# Patient Record
Sex: Male | Born: 1990 | Race: White | Hispanic: No | Marital: Single | Smoking: Current every day smoker
Health system: Southern US, Community
[De-identification: ages and names within clinical notes are randomized; demographics above are authoritative.]

## PROBLEM LIST (undated history)

## (undated) DIAGNOSIS — F32A Depression, unspecified: Secondary | ICD-10-CM

---

## 2013-03-16 ENCOUNTER — Emergency Department (HOSPITAL_COMMUNITY)
Admission: EM | Admit: 2013-03-16 | Discharge: 2013-03-16 | Discharge status: Against Medical Advice | Payer: 59 | Attending: Emergency Medicine | Admitting: Emergency Medicine

## 2013-03-16 ENCOUNTER — Emergency Department (HOSPITAL_COMMUNITY): Payer: 59

## 2013-03-16 ENCOUNTER — Encounter (HOSPITAL_COMMUNITY): Payer: Self-pay | Admitting: Emergency Medicine

## 2013-03-16 DIAGNOSIS — Y929 Unspecified place or not applicable: Secondary | ICD-10-CM | POA: Insufficient documentation

## 2013-03-16 DIAGNOSIS — R000 Tachycardia, unspecified: Secondary | ICD-10-CM | POA: Insufficient documentation

## 2013-03-16 DIAGNOSIS — F17200 Nicotine dependence, unspecified, uncomplicated: Secondary | ICD-10-CM | POA: Insufficient documentation

## 2013-03-16 DIAGNOSIS — Y9389 Activity, other specified: Secondary | ICD-10-CM | POA: Insufficient documentation

## 2013-03-16 DIAGNOSIS — R509 Fever, unspecified: Secondary | ICD-10-CM

## 2013-03-16 DIAGNOSIS — T401X4A Poisoning by heroin, undetermined, initial encounter: Secondary | ICD-10-CM | POA: Insufficient documentation

## 2013-03-16 DIAGNOSIS — T401X1A Poisoning by heroin, accidental (unintentional), initial encounter: Secondary | ICD-10-CM | POA: Insufficient documentation

## 2013-03-16 LAB — COMPREHENSIVE METABOLIC PANEL
ALT: 65 U/L — ABNORMAL HIGH (ref 0–53)
AST: 57 U/L — ABNORMAL HIGH (ref 0–37)
Albumin: 3.5 g/dL (ref 3.5–5.2)
Alkaline Phosphatase: 73 U/L (ref 39–117)
BUN: 7 mg/dL (ref 6–23)
CO2: 27 mEq/L (ref 19–32)
Calcium: 8.7 mg/dL (ref 8.4–10.5)
Chloride: 101 mEq/L (ref 96–112)
Creatinine, Ser: 0.91 mg/dL (ref 0.50–1.35)
GFR calc Af Amer: >90 mL/min (ref >90)
GFR calc non Af Amer: >90 mL/min (ref >90)
Glucose, Bld: 158 mg/dL — ABNORMAL HIGH (ref 70–99)
Potassium: 3.9 mEq/L (ref 3.5–5.1)
Sodium: 136 mEq/L (ref 135–145)
Total Bilirubin: 0.3 mg/dL (ref 0.3–1.2)
Total Protein: 6.7 g/dL (ref 6.0–8.3)

## 2013-03-16 LAB — CBC WITH DIFFERENTIAL/PLATELET
Basophils Absolute: 0 10*3/uL (ref 0.0–0.1)
Basophils Relative: 0 % (ref 0–1)
Eosinophils Absolute: 0 10*3/uL (ref 0.0–0.7)
Eosinophils Relative: 0 % (ref 0–5)
HCT: 40.6 % (ref 39.0–52.0)
Hemoglobin: 13.6 g/dL (ref 13.0–17.0)
Lymphocytes Relative: 7 % — ABNORMAL LOW (ref 12–46)
Lymphs Abs: 0.7 10*3/uL (ref 0.7–4.0)
MCH: 26.4 pg (ref 26.0–34.0)
MCHC: 33.5 g/dL (ref 30.0–36.0)
MCV: 78.8 fL (ref 78.0–100.0)
Monocytes Absolute: 0.9 10*3/uL (ref 0.1–1.0)
Monocytes Relative: 9 % (ref 3–12)
Neutro Abs: 8 10*3/uL — ABNORMAL HIGH (ref 1.7–7.7)
Neutrophils Relative %: 83 % — ABNORMAL HIGH (ref 43–77)
Platelets: 242 10*3/uL (ref 150–400)
RBC: 5.15 MIL/uL (ref 4.22–5.81)
RDW: 13.2 % (ref 11.5–15.5)
WBC: 9.5 10*3/uL (ref 4.0–10.5)

## 2013-03-16 LAB — URINALYSIS, ROUTINE W REFLEX MICROSCOPIC
Bilirubin Urine: NEGATIVE
Glucose, UA: 500 mg/dL — AB
Hgb urine dipstick: NEGATIVE
Ketones, ur: NEGATIVE mg/dL
Leukocytes, UA: NEGATIVE
Nitrite: NEGATIVE
Protein, ur: 100 mg/dL — AB
Specific Gravity, Urine: 1.027 (ref 1.005–1.030)
Urobilinogen, UA: 0.2 mg/dL (ref 0.0–1.0)
pH: 6.5 (ref 5.0–8.0)

## 2013-03-16 LAB — URINE MICROSCOPIC-ADD ON

## 2013-03-16 LAB — SEDIMENTATION RATE: Sed Rate: 5 mm/hr (ref 0–16)

## 2013-03-16 MED ORDER — IBUPROFEN 200 MG PO TABS
600.0000 mg | ORAL_TABLET | Freq: Once | ORAL | Status: AC
  Administered 2013-03-16: 600 mg via ORAL
  Filled 2013-03-16: qty 3

## 2013-03-16 MED ORDER — SODIUM CHLORIDE 0.9 % IV BOLUS (SEPSIS)
1000.0000 mL | Freq: Once | INTRAVENOUS | Status: AC
  Administered 2013-03-16: 1000 mL via INTRAVENOUS

## 2013-03-16 NOTE — ED Provider Notes (Signed)
CSN: [id number]     Arrival date & time 03/16/13  1238 History   First MD Initiated Contact with Patient 03/16/13 1246     Chief Complaint  Patient presents with  . Drug Overdose   (Consider location/radiation/quality/duration/timing/severity/associated sxs/prior Treatment) HPI  22 year old male with an unintentional heroin overdose. Patient with a long standing history of abuse. Denies intention to harm himself. He denies coingestion. Found by mother and she states his tongue was out of his mouth and he looked blue. Given narcan by EMS with brisk response. Currently no complaints. Denies any pain anywhere. No SOB. Pt offered to have ACT team speak with him in terms of his drug abuse but he is declining. Denies SI/HI or hallucinations.   History reviewed. No pertinent past medical history. History reviewed. No pertinent past surgical history. No family history on file. History  Substance Use Topics  . Smoking status: Current Every Day Smoker  . Smokeless tobacco: Not on file  . Alcohol Use: No    Review of Systems  All systems reviewed and negative, other than as noted in HPI.   Allergies  Review of patient's allergies indicates no known allergies.  Home Medications   Current Outpatient Rx  Name  Route  Sig  Dispense  Refill  . acetaminophen (TYLENOL) 500 MG tablet   Oral   Take 1,500 mg by mouth once.          BP 127/75  Pulse 122  Temp(Src) 102.2 F (39 C) (Oral)  Resp 21  SpO2 98% Physical Exam  Nursing note and vitals reviewed. Constitutional: He appears well-developed and well-nourished. No distress.  HENT:  Head: Normocephalic and atraumatic.  Eyes: Conjunctivae are normal. Right eye exhibits no discharge. Left eye exhibits no discharge.  Neck: Neck supple.  Cardiovascular: Regular rhythm and normal heart sounds.  Exam reveals no gallop and no friction rub.   No murmur heard. tachcyardic  Pulmonary/Chest: Effort normal and breath sounds normal. No  respiratory distress.  Abdominal: Soft. He exhibits no distension. There is no tenderness.  Musculoskeletal: He exhibits no edema and no tenderness.  Lower extremities symmetric as compared to each other. No calf tenderness. Negative Homan's. No palpable cords.   Neurological: He is alert.  Skin: Skin is warm and dry. He is not diaphoretic.  Psychiatric: He has a normal mood and affect. His behavior is normal. Thought content normal.    ED Course  Procedures (including critical care time) Labs Review Labs Reviewed - No data to display Imaging Review No results found.  MDM   1. Heroin overdose, initial encounter   2. Fever      22yM with unintentional heroin overdose. Given narcan. Pt unwilling to stay for observation or further w/u of fever. Understands that potential abnormal results may not be communicated to him. Has medical decision making capability. Understands half life of narcan less than many opiates and at risk for worsening symptoms, respiratory depression, anoxic brain injury, death, etc. Has no interest at this time in terms of getting help for drug abuse. Left ED under own power and in NAD.    Raeford Razor, MD 03/22/13 [phone]

## 2013-03-16 NOTE — ED Notes (Signed)
Per EMS: initially called out for unconscious pt upon arrival pt breathing about 6 times per minute. Nasal trumpet used, 20 g in left hand given 2 of Narcan. Pt not saying what he took but had orange cap in mouth.

## 2013-03-16 NOTE — ED Notes (Signed)
Pt denies SI/HI, states he took too much heroin today. Not sure how much he took but states it was too much.

## 2013-03-16 NOTE — ED Notes (Signed)
Bed: YN82 Expected date:  Expected time:  Means of arrival:  Comments: ems- overdose

## 2013-03-17 LAB — HEPATITIS PANEL, ACUTE
HCV Ab: NEGATIVE
Hep A IgM: NEGATIVE
Hep B C IgM: NEGATIVE
Hepatitis B Surface Ag: NEGATIVE

## 2013-03-22 LAB — CULTURE, BLOOD (ROUTINE X 2): Culture: NO GROWTH

## 2015-11-02 ENCOUNTER — Emergency Department (HOSPITAL_COMMUNITY)
Admission: EM | Admit: 2015-11-02 | Discharge: 2015-11-02 | Disposition: A | Discharge status: Home / Self Care | Payer: Managed Care, Other (non HMO) | Attending: Emergency Medicine | Admitting: Emergency Medicine

## 2015-11-02 ENCOUNTER — Encounter (HOSPITAL_COMMUNITY): Payer: Self-pay

## 2015-11-02 DIAGNOSIS — K0889 Other specified disorders of teeth and supporting structures: Secondary | ICD-10-CM | POA: Diagnosis not present

## 2015-11-02 DIAGNOSIS — F17200 Nicotine dependence, unspecified, uncomplicated: Secondary | ICD-10-CM | POA: Insufficient documentation

## 2015-11-02 NOTE — ED Notes (Signed)
Pt presents with c/o upper right side dental pain. Pt reports the pain has been ongoing but became unbearable today.

## 2018-12-18 ENCOUNTER — Encounter (HOSPITAL_COMMUNITY): Payer: Self-pay | Admitting: *Deleted

## 2018-12-18 ENCOUNTER — Other Ambulatory Visit: Payer: Self-pay

## 2018-12-18 ENCOUNTER — Emergency Department (HOSPITAL_COMMUNITY): Payer: No Typology Code available for payment source

## 2018-12-18 ENCOUNTER — Emergency Department (HOSPITAL_COMMUNITY)
Admission: EM | Admit: 2018-12-18 | Discharge: 2018-12-18 | Disposition: A | Discharge status: Home / Self Care | Payer: No Typology Code available for payment source | Attending: Emergency Medicine | Admitting: Emergency Medicine

## 2018-12-18 DIAGNOSIS — Y999 Unspecified external cause status: Secondary | ICD-10-CM | POA: Diagnosis not present

## 2018-12-18 DIAGNOSIS — Y939 Activity, unspecified: Secondary | ICD-10-CM | POA: Diagnosis not present

## 2018-12-18 DIAGNOSIS — R079 Chest pain, unspecified: Secondary | ICD-10-CM | POA: Insufficient documentation

## 2018-12-18 DIAGNOSIS — Y929 Unspecified place or not applicable: Secondary | ICD-10-CM | POA: Insufficient documentation

## 2018-12-18 DIAGNOSIS — Z87891 Personal history of nicotine dependence: Secondary | ICD-10-CM | POA: Diagnosis not present

## 2018-12-18 MED ORDER — ACETAMINOPHEN 500 MG PO TABS: 500.0000 mg | ORAL_TABLET | Freq: Four times a day (QID) | ORAL | 0 refills | Status: DC | PRN

## 2018-12-18 MED ORDER — IBUPROFEN 600 MG PO TABS: 600.0000 mg | ORAL_TABLET | Freq: Four times a day (QID) | ORAL | 0 refills | Status: DC | PRN

## 2018-12-18 MED ORDER — METHOCARBAMOL 500 MG PO TABS: 500.0000 mg | ORAL_TABLET | Freq: Two times a day (BID) | ORAL | 0 refills | Status: DC
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
## 2018-12-18 NOTE — ED Notes (Signed)
Bed: WTR7 Expected date:  Expected time:  Means of arrival:  Comments: 

## 2018-12-18 NOTE — Discharge Instructions (Signed)
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
## 2019-01-07 ENCOUNTER — Encounter (HOSPITAL_COMMUNITY): Payer: Self-pay

## 2019-01-07 ENCOUNTER — Other Ambulatory Visit: Payer: Self-pay

## 2019-01-07 ENCOUNTER — Emergency Department (HOSPITAL_COMMUNITY)
Admission: EM | Admit: 2019-01-07 | Discharge: 2019-01-07 | Disposition: A | Discharge status: Home / Self Care | Payer: Self-pay | Attending: Emergency Medicine | Admitting: Emergency Medicine

## 2019-01-07 DIAGNOSIS — L2489 Irritant contact dermatitis due to other agents: Secondary | ICD-10-CM | POA: Insufficient documentation

## 2019-01-07 DIAGNOSIS — Z87891 Personal history of nicotine dependence: Secondary | ICD-10-CM | POA: Insufficient documentation

## 2019-01-07 MED ORDER — PREDNISONE 20 MG PO TABS: ORAL_TABLET | ORAL | 0 refills | Status: DC

## 2019-01-07 MED ORDER — METHYLPREDNISOLONE SODIUM SUCC 125 MG IJ SOLR
125.0000 mg | Freq: Once | INTRAMUSCULAR | Status: AC
Start: 2019-01-07 — End: 2019-01-07
  Administered 2019-01-07: 04:00:00 125 mg via INTRAVENOUS
  Filled 2019-01-07: qty 2

## 2019-01-07 MED ORDER — DIPHENHYDRAMINE HCL 50 MG/ML IJ SOLN
25.0000 mg | Freq: Once | INTRAMUSCULAR | Status: AC
  Administered 2019-01-07: 25 mg via INTRAVENOUS
  Filled 2019-01-07: qty 1

## 2019-01-07 MED ORDER — TRIAMCINOLONE ACETONIDE 0.5 % EX OINT: 1.0000 "application " | TOPICAL_OINTMENT | Freq: Two times a day (BID) | CUTANEOUS | 3 refills | Status: DC

## 2019-01-07 MED ORDER — FAMOTIDINE 20 MG PO TABS: 20.0000 mg | ORAL_TABLET | Freq: Two times a day (BID) | ORAL | 0 refills | Status: DC

## 2019-01-07 MED ORDER — FAMOTIDINE IN NACL 20-0.9 MG/50ML-% IV SOLN
20.0000 mg | Freq: Once | INTRAVENOUS | Status: AC
  Administered 2019-01-07: 20 mg via INTRAVENOUS
  Filled 2019-01-07: qty 50

## 2019-01-07 MED ORDER — DIPHENHYDRAMINE HCL 25 MG PO TABS: 25.0000 mg | ORAL_TABLET | Freq: Four times a day (QID) | ORAL | 0 refills | Status: DC | PRN

## 2019-01-07 MED ORDER — TRIAMCINOLONE ACETONIDE 0.5 % EX OINT
TOPICAL_OINTMENT | Freq: Once | CUTANEOUS | Status: AC
  Administered 2019-01-07: 04:00:00 via TOPICAL
  Filled 2019-01-07: qty 15

## 2019-01-07 MED ORDER — HYDROCORTISONE 1 % EX CREA: TOPICAL_CREAM | CUTANEOUS | 0 refills | Status: DC

## 2019-01-07 MED ORDER — HYDROCORTISONE 1 % EX CREA
TOPICAL_CREAM | Freq: Once | CUTANEOUS | Status: AC
  Administered 2019-01-07: 04:00:00 via TOPICAL
  Filled 2019-01-07: qty 28

## 2019-01-07 NOTE — ED Provider Notes (Signed)
Emergency Department Provider Note   I have reviewed the triage vital signs and the nursing notes.   HISTORY  Chief Complaint Allergic Reaction   HPI Frederick Franklin is a 28 y.o. male who presents the emergency department today for rash.  Patient was working on some new grout over the last week and over the last couple days a progressively worsening swelling, itching, redness with multiple draining pustules to his face and even worse to his right axilla and proximal arm on that side.  Patient states his been leaking some light yellow clear drainage.  The biggest concern is the itching.  No fevers, nausea, vomiting.  No history of allergies in the past.   No other associated or modifying symptoms.    History reviewed. No pertinent past medical history.  There are no active problems to display for this patient.   History reviewed. No pertinent surgical history.  Current Outpatient Rx  . Order #: [id number] Class: Normal  . Order #: [id number] Class: Print  . Order #: [id number] Class: Print  . Order #: [id number] Class: Print  . Order #: [id number] Class: Print  . Order #: [id number] Class: Print    Allergies Patient has no known allergies.  History reviewed. No pertinent family history.  Social History Social History   Tobacco Use  . Smoking status: Former Games developermoker  . Smokeless tobacco: Never Used  Substance Use Topics  . Alcohol use: No  . Drug use: Never    Review of Systems  All other systems negative except as documented in the HPI. All pertinent positives and negatives as reviewed in the HPI. ____________________________________________   PHYSICAL EXAM:  VITAL SIGNS: ED Triage Vitals  Enc Vitals Group     BP 01/07/19 0152 133/87     Pulse Rate 01/07/19 0152 83     Resp 01/07/19 0152 18     Temp 01/07/19 0152 (!) 100.4 F (38 C)     Temp Source 01/07/19 0152 Oral     SpO2 01/07/19 0152 100 %     Weight 01/07/19 0152 250 lb (113.4 kg)     Height 01/07/19 0152 6'  (1.829 m)    Constitutional: Alert and oriented. Well appearing and in no acute distress. Eyes: significant edema to the point his eyes are barely open, Conjunctivae are normal. PERRL. EOMI. Head: Atraumatic. Nose: No congestion/rhinnorhea. Mouth/Throat: Mucous membranes are moist.  Oropharynx non-erythematous. Neck: No stridor.  No meningeal signs.   Cardiovascular: Normal rate, regular rhythm. Good peripheral circulation. Grossly normal heart sounds.   Respiratory: Normal respiratory effort.  No retractions. Lungs CTAB. Gastrointestinal: Soft and nontender. No distention.  Musculoskeletal: No lower extremity tenderness nor edema. No gross deformities of extremities. Neurologic:  Normal speech and language. No gross focal neurologic deficits are appreciated.  Skin:  Skin is warm, dry and intact. Contact dermatitis to face, right upper medial arm and axilla, bilateral hands, wrists and distal arms.   ____________________________________________   LABS (all labs ordered are listed, but only abnormal results are displayed)  Labs Reviewed - No data to display ____________________________________________  RADIOLOGY  No results found.  ____________________________________________   PROCEDURES  Procedure(s) performed:   Procedures   ____________________________________________   INITIAL IMPRESSION / ASSESSMENT AND PLAN / ED COURSE  Doubt anaphylactic reaction it appears patient has pretty severe contact dermatitis.  Will initiate steroids and antipruritics.  Pt VS reviewed and initially had a mild fever. This resolved without intervention, may have been error or 2/2 immune response itself. The rash seemed very  clearly like contact dermatitis. No e/o SJS, cellulitis, meningitis or other infectious causes. tx in ED provided significant benefit and patient had vast improvement in symptoms and rash. Taper steroids and symptomatic treatment at home, rturn if worsening.       Pertinent labs & imaging results that were available during my care of the patient were reviewed by me and considered in my medical decision making (see chart for details).   A medical screening exam was performed and I feel the patient has had an appropriate workup for their chief complaint at this time and likelihood of emergent condition existing is low. They have been counseled on decision, discharge, follow up and which symptoms necessitate immediate return to the emergency department. They or their family verbally stated understanding and agreement with plan and discharged in stable condition.   ____________________________________________  FINAL CLINICAL IMPRESSION(S) / ED DIAGNOSES  Final diagnoses:  Irritant contact dermatitis due to other agents     MEDICATIONS GIVEN DURING THIS VISIT:  Medications  diphenhydrAMINE (BENADRYL) injection 25 mg (25 mg Intravenous Given 01/07/19 0330)  methylPREDNISolone sodium succinate (SOLU-MEDROL) 125 mg/2 mL injection 125 mg (125 mg Intravenous Given 01/07/19 0330)  famotidine (PEPCID) IVPB 20 mg premix (0 mg Intravenous Stopped 01/07/19 0359)  triamcinolone ointment (KENALOG) 0.5 % ( Topical Given 01/07/19 0406)  hydrocortisone cream 1 % ( Topical Given 01/07/19 0406)     NEW OUTPATIENT MEDICATIONS STARTED DURING THIS VISIT:  Discharge Medication List as of 01/07/2019  6:22 AM    START taking these medications   Details  famotidine (PEPCID) 20 MG tablet Take 1 tablet (20 mg total) by mouth 2 (two) times daily., Starting Sat 01/07/2019, Print    hydrocortisone cream 1 % Apply to affected area 2 times daily on face, Print    predniSONE (DELTASONE) 20 MG tablet 3 tabs po daily x 3 days, then 2 tabs x 3 days, then 1.5 tabs x 3 days, then 1 tab x 3 days, then 0.5 tabs x 3 days, Print    triamcinolone ointment (KENALOG) 0.5 % Apply 1 application topically 2 (two) times daily. On body and extremities, NOT face, Starting Sat 01/07/2019, Print         Note:  This note was prepared with assistance of Dragon voice recognition software. Occasional wrong-word or sound-a-like substitutions may have occurred due to the inherent limitations of voice recognition software.   Merrily Pew, MD 01/08/19 1050

## 2019-01-07 NOTE — ED Triage Notes (Signed)
Pt presents with swollen red eyes, hives on face, neck, arms, and chest. He states that it started 5-6 days ago and has gotten progressively worse. He states that he has been using grout in his bathroom and thinks that it the cause. He states that he took a benadryl yesterday with minimal relief. Denies SOB, throat tightness, or dysphagia. No wheezing noted.

## 2019-01-21 ENCOUNTER — Encounter (HOSPITAL_COMMUNITY): Payer: Self-pay

## 2019-01-21 ENCOUNTER — Other Ambulatory Visit: Payer: Self-pay

## 2019-01-21 ENCOUNTER — Emergency Department (HOSPITAL_COMMUNITY)
Admission: EM | Admit: 2019-01-21 | Discharge: 2019-01-21 | Disposition: A | Discharge status: Home / Self Care | Payer: Self-pay | Attending: Emergency Medicine | Admitting: Emergency Medicine

## 2019-01-21 DIAGNOSIS — Z87891 Personal history of nicotine dependence: Secondary | ICD-10-CM | POA: Insufficient documentation

## 2019-01-21 DIAGNOSIS — L2489 Irritant contact dermatitis due to other agents: Secondary | ICD-10-CM | POA: Insufficient documentation

## 2019-01-21 DIAGNOSIS — Z79899 Other long term (current) drug therapy: Secondary | ICD-10-CM | POA: Insufficient documentation

## 2019-01-21 MED ORDER — FAMOTIDINE 20 MG PO TABS: 20.0000 mg | ORAL_TABLET | Freq: Two times a day (BID) | ORAL | 0 refills | Status: DC

## 2019-01-21 MED ORDER — DEXAMETHASONE SODIUM PHOSPHATE 10 MG/ML IJ SOLN
10.0000 mg | Freq: Once | INTRAMUSCULAR | Status: AC
  Administered 2019-01-21: 10 mg via INTRAMUSCULAR
  Filled 2019-01-21: qty 1

## 2019-01-21 MED ORDER — PREDNISONE 10 MG PO TABS: ORAL_TABLET | ORAL | 0 refills | Status: DC

## 2019-01-21 MED ORDER — DIPHENHYDRAMINE HCL 25 MG PO TABS: 25.0000 mg | ORAL_TABLET | Freq: Four times a day (QID) | ORAL | 0 refills | Status: DC | PRN

## 2019-01-21 NOTE — ED Notes (Signed)
He has a few questions regarding his treatment plan. Dr. Sedonia Small will speak with him shortly, and I will revisit d/c.

## 2019-01-21 NOTE — Discharge Instructions (Signed)
You have 3 refills for the triamcinolone cream that you put on your body from your last visit. Begin taking the prednisone, as prescribed. Take benadryl more frequently - every 6 hours - for itching. Take the pepcid every 12 hours. Schedule an appointment with the allergy specialist if symptoms do not improve. Return to the ER if you develop swelling of your lips or tongue, pus draining from your rash, or new or concerning symptoms.

## 2019-01-21 NOTE — ED Provider Notes (Signed)
[facility]-EMERGENCY DEPT Provider Note   CSN: [id number] Arrival date & time: 01/21/19  1044    History   Chief Complaint Chief Complaint  Patient presents with  . Allergic Reaction    HPI Frederick Franklin is a 28 y.o. male presenting to the emergency department with recurrent allergic reaction.  Patient was seen on 01/07/2019 and diagnosed with severe contact dermatitis.  He works in [employer] and came in contact with a new [employer] librariangrout material.  He was prescribed prednisone taper, topical steroid creams, and instructed to take Pepcid and Benadryl.  He states the interventions were improving his symptoms and his rash was improving, however once he got down to the 10 mg dose of the prednisone taper, his rash began worsening.  He states he has not come into physical contact with the substance again, however he is around it during the day at work.  The rash on his face is significantly improved.  It is worse on his right arm with associated swelling.  The rash is also present to remainder of his extremities as well as his trunk.  It is itchy, not draining. He has no assoc swelling of lips or tongue, abd pain, vomiting. No recent abx or new medication prior to initial onset of rash.       The history is provided by the patient and medical records.    History reviewed. No pertinent past medical history.  There are no active problems to display for this patient.   History reviewed. No pertinent surgical history.      Home Medications    Prior to Admission medications   Medication Sig Start Date End Date Taking? Authorizing Provider  acetaminophen (TYLENOL) 500 MG tablet Take 1 tablet (500 mg total) by mouth every 6 (six) hours as needed. Patient taking differently: Take 1,500 mg by mouth every 6 (six) hours as needed for moderate pain.  12/18/18   Law, Waylan BogaAlexandra M, PA-C  diphenhydrAMINE (BENADRYL) 25 MG tablet Take 1 tablet (25 mg total) by mouth every 6 (six) hours as needed  for itching. 01/21/19   Robinson, [place] N, PA-C  famotidine (PEPCID) 20 MG tablet Take 1 tablet (20 mg total) by mouth 2 (two) times daily. 01/21/19   Robinson, [place] N, PA-C  hydrocortisone cream 1 % Apply to affected area 2 times daily on face 01/07/19   Mesner, Barbara CowerJason, MD  predniSONE (DELTASONE) 10 MG tablet 3 tabs po daily x 5 days, then 2 tabs x 5 days, then 1 tab x 5 days 01/21/19   Robinson, [place] N, PA-C  triamcinolone ointment (KENALOG) 0.5 % Apply 1 application topically 2 (two) times daily. On body and extremities, NOT face 01/07/19   Mesner, Barbara CowerJason, MD    Family History History reviewed. No pertinent family history.  Social History Social History   Tobacco Use  . Smoking status: Former Games developermoker  . Smokeless tobacco: Never Used  Substance Use Topics  . Alcohol use: No  . Drug use: Never     Allergies   Patient has no known allergies.   Review of Systems Review of Systems  Skin: Positive for rash.  All other systems reviewed and are negative.    Physical Exam Updated Vital Signs BP (!) 150/80 (BP Location: Left Arm)   Pulse 97   Temp 98 F (36.7 C) (Oral)   Resp 18   SpO2 99%   Physical Exam Vitals signs and nursing note reviewed.  Constitutional:      General:  He is not in acute distress.    Appearance: He is well-developed. He is obese. He is not ill-appearing.  HENT:     Head: Normocephalic and atraumatic.     Mouth/Throat:     Mouth: Mucous membranes are moist.     Pharynx: Oropharynx is clear.  Eyes:     Conjunctiva/sclera: Conjunctivae normal.  Cardiovascular:     Rate and Rhythm: Normal rate and regular rhythm.  Pulmonary:     Effort: Pulmonary effort is normal. No respiratory distress.     Breath sounds: Normal breath sounds.  Abdominal:     Palpations: Abdomen is soft.  Skin:    General: Skin is warm.     Comments: Erythematous maculopapular and erythematous wheels are present diffusely: combination of wheels and maculopapular rash to b/l arms,  right worse than left. Large wheels to anterior aspect of knees, right trunk and right abd with large area of erythematous wheel, b/l post hips. Back with diffuse maculopapular rash. No drainage or desqumation, no bullae. Rash has excoriation. Skin to right forearm is edematous, no fluctuance.  Neurological:     Mental Status: He is alert.  Psychiatric:        Behavior: Behavior normal.      ED Treatments / Results  Labs (all labs ordered are listed, but only abnormal results are displayed) Labs Reviewed - No data to display  EKG None  Radiology No results found.  Procedures Procedures (including critical care time)  Medications Ordered in ED Medications  dexamethasone (DECADRON) injection 10 mg (has no administration in time range)     Initial Impression / Assessment and Plan / ED Course  I have reviewed the triage vital signs and the nursing notes.  Pertinent labs & imaging results that were available during my care of the patient were reviewed by me and considered in my medical decision making (see chart for details).        Patient presenting with recurrent symptoms due to contact dermatitis.  He was seen on 01/07/2019 discharged with prednisone taper as well as antihistamines and topical steroids.  Symptoms improved, however once his steroid taper got down to 10 mg his rash began returning.  Rash does not appear to be infected.  There is no desquamation or bulla.  Low suspicion for SJS, TPN, meningitis.  No signs or symptoms of anaphylaxis.  Patient discussed with and evaluated by Dr. Pilar PlateBero.  Will extend out his taper of prednisone.  Also instructed to take Benadryl more frequently, as he was only taking twice per day.  Provide allergy specialist referral for further management.  Patient requesting injection for symptom relief piror to discharge as well as "drainage" of the swelling in his right arm. Will provide IM Decadron.  Discussed that there is no fluid collection to  drain in the right arm, there appears to be just swelling due to the dermatitis. Return precautions discussed. Safe for discharge.  Discussed results, findings, treatment and follow up. Patient advised of return precautions. Patient verbalized understanding and agreed with plan.   Final Clinical Impressions(s) / ED Diagnoses   Final diagnoses:  Irritant contact dermatitis due to other agents    ED Discharge Orders         Ordered    predniSONE (DELTASONE) 10 MG tablet     01/21/19 1231    famotidine (PEPCID) 20 MG tablet  2 times daily     01/21/19 1231    diphenhydrAMINE (BENADRYL) 25 MG tablet  Every  6 hours PRN     01/21/19 1231           Robinson, [place] N, [place] 01/21/19 1315    Maudie Flakes, MD 01/25/19 1024

## 2019-01-21 NOTE — ED Triage Notes (Addendum)
Patient c/o allergic reaction, hives, and rash all over body.   Patient was seen in ED 2 weeks ago. Patient was put on steroids and patient states he is almost done. He has one dose left. Patient states reaction initially got better and started to get worse again 3 days ago.   C/o intermittent  shob when he is at work in the heat.   Patient denies voice changes.   8/10 pain tightness and itching.   A/ox4 Ambulatory in triage.   Speaking in complete sentences with no problems.    Unknown allergies.   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

## 2020-03-14 ENCOUNTER — Other Ambulatory Visit: Payer: Self-pay

## 2020-03-14 ENCOUNTER — Emergency Department (HOSPITAL_COMMUNITY)
Admission: EM | Admit: 2020-03-14 | Discharge: 2020-03-15 | Disposition: A | Discharge status: Home / Self Care | Payer: Self-pay | Attending: Emergency Medicine | Admitting: Emergency Medicine

## 2020-03-14 ENCOUNTER — Encounter (HOSPITAL_COMMUNITY): Payer: Self-pay | Admitting: *Deleted

## 2020-03-14 ENCOUNTER — Emergency Department (HOSPITAL_COMMUNITY): Payer: Self-pay

## 2020-03-14 DIAGNOSIS — R4182 Altered mental status, unspecified: Secondary | ICD-10-CM | POA: Insufficient documentation

## 2020-03-14 DIAGNOSIS — Y92410 Unspecified street and highway as the place of occurrence of the external cause: Secondary | ICD-10-CM | POA: Insufficient documentation

## 2020-03-14 DIAGNOSIS — Y999 Unspecified external cause status: Secondary | ICD-10-CM | POA: Insufficient documentation

## 2020-03-14 DIAGNOSIS — Y9389 Activity, other specified: Secondary | ICD-10-CM | POA: Insufficient documentation

## 2020-03-14 DIAGNOSIS — R000 Tachycardia, unspecified: Secondary | ICD-10-CM | POA: Insufficient documentation

## 2020-03-14 DIAGNOSIS — S20312A Abrasion of left front wall of thorax, initial encounter: Secondary | ICD-10-CM | POA: Insufficient documentation

## 2020-03-14 DIAGNOSIS — Z79899 Other long term (current) drug therapy: Secondary | ICD-10-CM | POA: Insufficient documentation

## 2020-03-14 DIAGNOSIS — Z87891 Personal history of nicotine dependence: Secondary | ICD-10-CM | POA: Insufficient documentation

## 2020-03-14 LAB — ETHANOL: Alcohol, Ethyl (B): <10 mg/dL (ref <10)

## 2020-03-14 MED ORDER — IBUPROFEN 800 MG PO TABS: 800.0000 mg | ORAL_TABLET | Freq: Once | ORAL | Status: DC
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
## 2020-03-14 NOTE — ED Notes (Signed)
Patient arrived to the room via stretcher with c-collar in place, a/o x4, no c/o voiced at this time.  Will continue to monitor.
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
## 2020-03-15 NOTE — Discharge Instructions (Addendum)
At this time there does not appear to be the presence of an emergent medical condition, however there is always the potential for conditions to change. Please read and follow the below instructions.  Please return to the Emergency Department immediately for any new or worsening symptoms. Please be sure to follow up with your Primary Care Provider within one week regarding your visit today; please call their office to schedule an appointment even if you are feeling better for a follow-up visit.  Get help right away if: You have: Loss of feeling (numbness), tingling, or weakness in your arms or legs. Very bad neck pain, especially tenderness in the middle of the back of your neck. A change in your ability to control your pee or poop (stool). More pain in any area of your body. Swelling in any area of your body, especially your legs. Shortness of breath or light-headedness. Chest pain. Blood in your pee, poop, or vomit. Very bad pain in your belly (abdomen) or your back. Very bad headaches or headaches that are getting worse. Sudden vision loss or double vision. Your eye suddenly turns red. The black center of your eye (pupil) is an odd shape or size. You have any new/concerning or worsening of symptoms   Please read the additional information packets attached to your discharge summary.  Do not take your medicine if  develop an itchy rash, swelling in your mouth or lips, or difficulty breathing; call 911 and seek immediate emergency medical attention if this occurs.  You may review your lab tests and imaging results in their entirety on your MyChart account.  Please discuss all results of fully with your primary care provider and other specialist at your follow-up visit.  Note: Portions of this text may have been transcribed using voice recognition software. Every effort was made to ensure accuracy; however, inadvertent computerized transcription errors may still be present.

## 2020-03-16 ENCOUNTER — Other Ambulatory Visit: Payer: Self-pay

## 2020-03-16 ENCOUNTER — Emergency Department (HOSPITAL_COMMUNITY)
Admission: EM | Admit: 2020-03-16 | Discharge: 2020-03-19 | Disposition: A | Discharge status: Other Acute Inpatient Hospital | Payer: Self-pay | Attending: Emergency Medicine | Admitting: Emergency Medicine

## 2020-03-16 DIAGNOSIS — F3112 Bipolar disorder, current episode manic without psychotic features, moderate: Secondary | ICD-10-CM | POA: Insufficient documentation

## 2020-03-16 DIAGNOSIS — Z79899 Other long term (current) drug therapy: Secondary | ICD-10-CM | POA: Insufficient documentation

## 2020-03-16 DIAGNOSIS — F1120 Opioid dependence, uncomplicated: Secondary | ICD-10-CM | POA: Diagnosis present

## 2020-03-16 DIAGNOSIS — R45851 Suicidal ideations: Secondary | ICD-10-CM | POA: Insufficient documentation

## 2020-03-16 DIAGNOSIS — Z87891 Personal history of nicotine dependence: Secondary | ICD-10-CM | POA: Insufficient documentation

## 2020-03-16 DIAGNOSIS — Z20822 Contact with and (suspected) exposure to covid-19: Secondary | ICD-10-CM | POA: Insufficient documentation

## 2020-03-16 DIAGNOSIS — F1910 Other psychoactive substance abuse, uncomplicated: Secondary | ICD-10-CM

## 2020-03-16 LAB — COMPREHENSIVE METABOLIC PANEL
ALT: 78 U/L — ABNORMAL HIGH (ref 0–44)
AST: 113 U/L — ABNORMAL HIGH (ref 15–41)
Albumin: 3.1 g/dL — ABNORMAL LOW (ref 3.5–5.0)
Alkaline Phosphatase: 85 U/L (ref 38–126)
Anion gap: 10 (ref 5–15)
BUN: 7 mg/dL (ref 6–20)
CO2: 24 mmol/L (ref 22–32)
Calcium: 8.5 mg/dL — ABNORMAL LOW (ref 8.9–10.3)
Chloride: 106 mmol/L (ref 98–111)
Creatinine, Ser: 0.76 mg/dL (ref 0.61–1.24)
GFR calc Af Amer: >60 mL/min (ref >60)
GFR calc non Af Amer: >60 mL/min (ref >60)
Glucose, Bld: 121 mg/dL — ABNORMAL HIGH (ref 70–99)
Potassium: 3.8 mmol/L (ref 3.5–5.1)
Sodium: 140 mmol/L (ref 135–145)
Total Bilirubin: 0.5 mg/dL (ref 0.3–1.2)
Total Protein: 6.9 g/dL (ref 6.5–8.1)

## 2020-03-16 LAB — RAPID URINE DRUG SCREEN, HOSP PERFORMED
Amphetamines: NOT DETECTED
Barbiturates: NOT DETECTED
Benzodiazepines: POSITIVE — AB
Cocaine: POSITIVE — AB
Opiates: NOT DETECTED
Tetrahydrocannabinol: NOT DETECTED

## 2020-03-16 LAB — ACETAMINOPHEN LEVEL: Acetaminophen (Tylenol), Serum: <10 ug/mL — ABNORMAL LOW (ref 10–30)

## 2020-03-16 LAB — ETHANOL: Alcohol, Ethyl (B): <10 mg/dL (ref <10)

## 2020-03-16 LAB — SARS CORONAVIRUS 2 BY RT PCR (HOSPITAL ORDER, PERFORMED IN ~~LOC~~ HOSPITAL LAB): SARS Coronavirus 2: NEGATIVE

## 2020-03-16 LAB — SALICYLATE LEVEL: Salicylate Lvl: <7 mg/dL — ABNORMAL LOW (ref 7.0–30.0)

## 2020-03-16 MED ORDER — ACETAMINOPHEN 325 MG PO TABS
650.0000 mg | ORAL_TABLET | Freq: Four times a day (QID) | ORAL | Status: DC | PRN
  Administered 2020-03-16 – 2020-03-18 (×4): 650 mg via ORAL
  Filled 2020-03-16 (×4): qty 2
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
## 2020-03-16 NOTE — ED Notes (Signed)
Pt mother phone number [phone].
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
## 2020-03-16 NOTE — ED Notes (Signed)
Pt sleeping, resting comfortably

## 2020-03-16 NOTE — ED Triage Notes (Signed)
Pt to [facility] by EMS. Pt IVCd by mother. Pt alert but drowsy. According to respondent pt  uses heroin , stated he want to kill self, [name] on Thursday , call self failure, tried to steal a lawnmower unsteady slurred speech and can not stay awake.

## 2020-03-17 DIAGNOSIS — F1120 Opioid dependence, uncomplicated: Secondary | ICD-10-CM

## 2020-03-17 DIAGNOSIS — F1910 Other psychoactive substance abuse, uncomplicated: Secondary | ICD-10-CM

## 2020-03-17 MED ORDER — NAPROXEN 500 MG PO TABS
500.0000 mg | ORAL_TABLET | Freq: Two times a day (BID) | ORAL | Status: DC | PRN
  Administered 2020-03-17 – 2020-03-19 (×4): 500 mg via ORAL
  Filled 2020-03-17 (×4): qty 1

## 2020-03-17 MED ORDER — LOPERAMIDE HCL 2 MG PO CAPS: 2.0000 mg | ORAL_CAPSULE | ORAL | Status: DC | PRN

## 2020-03-17 MED ORDER — ONDANSETRON 4 MG PO TBDP: 4.0000 mg | ORAL_TABLET | Freq: Four times a day (QID) | ORAL | Status: DC | PRN

## 2020-03-17 MED ORDER — GABAPENTIN 100 MG PO CAPS
200.0000 mg | ORAL_CAPSULE | Freq: Two times a day (BID) | ORAL | Status: DC
  Administered 2020-03-17 – 2020-03-19 (×5): 200 mg via ORAL
  Filled 2020-03-17 (×5): qty 2

## 2020-03-17 MED ORDER — HYDROXYZINE HCL 25 MG PO TABS
25.0000 mg | ORAL_TABLET | Freq: Four times a day (QID) | ORAL | Status: DC | PRN
  Administered 2020-03-17 – 2020-03-19 (×6): 25 mg via ORAL
  Filled 2020-03-17 (×6): qty 1

## 2020-03-17 MED ORDER — DICYCLOMINE HCL 20 MG PO TABS
20.0000 mg | ORAL_TABLET | Freq: Four times a day (QID) | ORAL | Status: DC | PRN
  Administered 2020-03-17 – 2020-03-19 (×4): 20 mg via ORAL
  Filled 2020-03-17 (×4): qty 1

## 2020-03-17 MED ORDER — METHOCARBAMOL 500 MG PO TABS
500.0000 mg | ORAL_TABLET | Freq: Three times a day (TID) | ORAL | Status: DC | PRN
  Administered 2020-03-17 – 2020-03-19 (×5): 500 mg via ORAL
  Filled 2020-03-17 (×5): qty 1

## 2020-03-17 NOTE — ED Notes (Signed)
Pt mother here to visit with pt at this time.

## 2020-03-17 NOTE — ED Notes (Addendum)
Patient requested phone call

## 2020-03-17 NOTE — ED Notes (Signed)
Patient asleep rise and fall of chest breathing observed 

## 2020-03-17 NOTE — Progress Notes (Addendum)
2:25pm: CSW faxed patient's clinical information out for review to the following facilities:  [facility]  9:30am: CSW received consult for patient to determine if he has been receiving his methadone dose from [facility]. CSW attempted to reach staff at [facility] without success - facility is closed until Monday morning at 9am, and the general voicemail box is full.  Edwin Dada, MSW, LCSW-A Transitions of Care  Clinical Social Worker  [facility]  Medical ICU [phone]

## 2020-03-17 NOTE — ED Notes (Signed)
Fire drill patients door closed  

## 2020-03-17 NOTE — ED Notes (Signed)
Pt rested throughout shift with only waking once to ask for time and when morning medications would be given. Pt calm and cooperative towards staff.

## 2020-03-17 NOTE — ED Provider Notes (Signed)
Emergency Medicine Observation Re-evaluation Note  Frederick Franklin is a 29 y.o. male, seen on rounds today.  Pt initially presented to the ED for complaints of Medical Clearance (IVCd), Addiction Problem, and Psychiatric Evaluation Currently, the patient is ivc for addiction and concern for harm to self.  Physical Exam  BP 124/73 (BP Location: Left Arm)   Pulse 80   Temp 98.7 F (37.1 C) (Oral)   Resp 20   SpO2 100%  Physical Exam General: wdwn male nad Cardiac: rrr Lungs: cta Psych: denies depression, admits to relapse  ED Course / MDM  EKG:    I have reviewed the labs performed to date as well as medications administered while in observation.  Recent changes in the last 24 hours include patient awaiting peer support and reevaluation by psych.  Plan  Current plan is for peer support and reevaluation . Patient is under full IVC at this time.   Margarita Grizzle, MD 03/17/20 [phone]

## 2020-03-17 NOTE — ED Notes (Signed)
Patient has been calm and cooperative today.  He is complaining often of aches and pains from withdrawal.

## 2020-03-17 NOTE — ED Notes (Signed)
Patients has visitor/ mother

## 2020-03-17 NOTE — ED Notes (Signed)
Patient received dinner tray 

## 2020-03-17 NOTE — ED Notes (Signed)
Patient asked to use the phone. 

## 2020-03-17 NOTE — ED Notes (Signed)
Patient resting watching TV.

## 2020-03-17 NOTE — ED Notes (Signed)
Patient resting quietly watching TV.
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
## 2020-03-17 NOTE — ED Notes (Signed)
Patient sleeping/rise and fall of chest breathing observed 

## 2020-03-18 MED ORDER — NICOTINE 14 MG/24HR TD PT24
14.0000 mg | MEDICATED_PATCH | Freq: Every day | TRANSDERMAL | Status: DC
  Administered 2020-03-18 – 2020-03-19 (×2): 14 mg via TRANSDERMAL
  Filled 2020-03-18 (×2): qty 1

## 2020-03-18 NOTE — ED Notes (Signed)
Pt had visitation from mother at beginning of shift that was uneventful. Afterwards pt continued to ask for medications and food. Pt was educated that medications could only be given at certain timing to prevent injury and that night after 2000 amount of food was limited. Pt rested with intermittent times of going to restroom. Otherwise shift was uneventful.

## 2020-03-18 NOTE — ED Notes (Signed)
Pt has been alert and cooperative this shift. Pt slightly elevated, rapid speech at times.

## 2020-03-18 NOTE — BHH Counselor (Signed)
Disposition:   [name], NP, patient continues to be recommended for inpatient treatment.
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
## 2020-03-18 NOTE — BH Assessment (Addendum)
BHH Assessment Progress Note  Per Nelly Rout, MD, this pt requires psychiatric hospitalization at this time.  Pt presents under IVC initiated by pt's mother and upheld by EDP Gerhard Munch, MD.  At 14:46 this writer spoke to Shanon Ace, RN at [facility].  She agrees to review pt for consideration for admission at [facility].  Final disposition is pending as of this writing.  Doylene Canning, [place] Behavioral Health Coordinator [phone]   Addendum:  At 15:39 Tosin [phone]) calls from [facility] with medical clearance concerns.  These have been passed on to pt's nurse, Beth, who agrees to contact Tosin to address them.  Final disposition is pending as of this writing.  Doylene Canning, [place] Behavioral Health Coordinator [phone]

## 2020-03-18 NOTE — Progress Notes (Signed)
TOC CSW reached out to [facility] at [phone].  CSW spoke with Silvia/RN at [facility].  She stated that pt was placed on an administrative detox due to being noncompliant.  Pts last dose was 03/10/2020 at 9:15am.  Pts dosage is 26 mg of Methadone Cherry.  [facility] dosage is 10 mg per milliliter.  CSW will continue to follow for dc needs.  Osceola Depaz Tarpley-Carter, MSW, LCSW-A                  [facility] ED Transitions of CareClinical Social Worker [email or web address] [phone]

## 2020-03-18 NOTE — BHH Counselor (Signed)
TTS attempted to contact patient's dad Frederick Franklin [phone]) to verify coordination of treatment once client's is discharged.

## 2020-03-18 NOTE — BHH Counselor (Signed)
Collateral: Dolph Tavano [phone]) - father   Per conversation with patient's father he has not agreed to pay for residential or outpatient treatment. Report his son has a pattern of being manipulative and is what he can to be discharged from the hospital. Patient's father report he will follow the doctor's recommendation of inpatient treatment.

## 2020-03-19 ENCOUNTER — Other Ambulatory Visit: Payer: Self-pay

## 2020-03-19 ENCOUNTER — Inpatient Hospital Stay (HOSPITAL_COMMUNITY)
Admission: AD | Admit: 2020-03-19 | Discharge: 2020-03-25 | DRG: 885 | Disposition: A | Discharge status: Home / Self Care | Payer: Federal, State, Local not specified - Other | Attending: Psychiatry | Admitting: Psychiatry

## 2020-03-19 ENCOUNTER — Encounter (HOSPITAL_COMMUNITY): Payer: Self-pay | Admitting: Psychiatry

## 2020-03-19 DIAGNOSIS — K219 Gastro-esophageal reflux disease without esophagitis: Secondary | ICD-10-CM | POA: Diagnosis present

## 2020-03-19 DIAGNOSIS — Z590 Homelessness: Secondary | ICD-10-CM | POA: Diagnosis not present

## 2020-03-19 DIAGNOSIS — Z87891 Personal history of nicotine dependence: Secondary | ICD-10-CM

## 2020-03-19 DIAGNOSIS — G4700 Insomnia, unspecified: Secondary | ICD-10-CM | POA: Diagnosis present

## 2020-03-19 DIAGNOSIS — K5900 Constipation, unspecified: Secondary | ICD-10-CM | POA: Diagnosis present

## 2020-03-19 DIAGNOSIS — F1910 Other psychoactive substance abuse, uncomplicated: Secondary | ICD-10-CM | POA: Diagnosis not present

## 2020-03-19 DIAGNOSIS — Z62811 Personal history of psychological abuse in childhood: Secondary | ICD-10-CM | POA: Diagnosis present

## 2020-03-19 DIAGNOSIS — B1920 Unspecified viral hepatitis C without hepatic coma: Secondary | ICD-10-CM | POA: Diagnosis present

## 2020-03-19 DIAGNOSIS — F419 Anxiety disorder, unspecified: Secondary | ICD-10-CM | POA: Diagnosis present

## 2020-03-19 DIAGNOSIS — Z62810 Personal history of physical and sexual abuse in childhood: Secondary | ICD-10-CM | POA: Diagnosis present

## 2020-03-19 DIAGNOSIS — F1310 Sedative, hypnotic or anxiolytic abuse, uncomplicated: Secondary | ICD-10-CM | POA: Diagnosis not present

## 2020-03-19 DIAGNOSIS — F1414 Cocaine abuse with cocaine-induced mood disorder: Secondary | ICD-10-CM | POA: Diagnosis not present

## 2020-03-19 DIAGNOSIS — M62838 Other muscle spasm: Secondary | ICD-10-CM | POA: Diagnosis present

## 2020-03-19 DIAGNOSIS — F1120 Opioid dependence, uncomplicated: Secondary | ICD-10-CM | POA: Diagnosis present

## 2020-03-19 DIAGNOSIS — F332 Major depressive disorder, recurrent severe without psychotic features: Principal | ICD-10-CM | POA: Diagnosis present

## 2020-03-19 LAB — CBC WITH DIFFERENTIAL/PLATELET
Abs Immature Granulocytes: 0.03 10*3/uL (ref 0.00–0.07)
Basophils Absolute: 0 10*3/uL (ref 0.0–0.1)
Basophils Relative: 0 %
Eosinophils Absolute: 0.1 10*3/uL (ref 0.0–0.5)
Eosinophils Relative: 1 %
HCT: 41.7 % (ref 39.0–52.0)
Hemoglobin: 14.2 g/dL (ref 13.0–17.0)
Immature Granulocytes: 0 %
Lymphocytes Relative: 33 %
Lymphs Abs: 3.2 10*3/uL (ref 0.7–4.0)
MCH: 27.3 pg (ref 26.0–34.0)
MCHC: 34.1 g/dL (ref 30.0–36.0)
MCV: 80 fL (ref 80.0–100.0)
Monocytes Absolute: 0.5 10*3/uL (ref 0.1–1.0)
Monocytes Relative: 5 %
Neutro Abs: 5.8 10*3/uL (ref 1.7–7.7)
Neutrophils Relative %: 61 %
Platelets: 208 10*3/uL (ref 150–400)
RBC: 5.21 MIL/uL (ref 4.22–5.81)
RDW: 14.2 % (ref 11.5–15.5)
WBC: 9.6 10*3/uL (ref 4.0–10.5)
nRBC: 0 % (ref 0.0–0.2)

## 2020-03-19 LAB — HEPATIC FUNCTION PANEL
ALT: 96 U/L — ABNORMAL HIGH (ref 0–44)
AST: 114 U/L — ABNORMAL HIGH (ref 15–41)
Albumin: 3.8 g/dL (ref 3.5–5.0)
Alkaline Phosphatase: 83 U/L (ref 38–126)
Bilirubin, Direct: 0.2 mg/dL (ref 0.0–0.2)
Indirect Bilirubin: 0.4 mg/dL (ref 0.3–0.9)
Total Bilirubin: 0.6 mg/dL (ref 0.3–1.2)
Total Protein: 7.9 g/dL (ref 6.5–8.1)

## 2020-03-19 LAB — HEMOGLOBIN A1C
Hgb A1c MFr Bld: 5.2 % (ref 4.8–5.6)
Mean Plasma Glucose: 102.54 mg/dL

## 2020-03-19 MED ORDER — PANTOPRAZOLE SODIUM 40 MG PO TBEC
40.0000 mg | DELAYED_RELEASE_TABLET | Freq: Every day | ORAL | Status: DC
  Administered 2020-03-19 – 2020-03-25 (×7): 40 mg via ORAL
  Filled 2020-03-19 (×9): qty 1

## 2020-03-19 MED ORDER — CLONIDINE HCL 0.1 MG PO TABS
0.1000 mg | ORAL_TABLET | Freq: Four times a day (QID) | ORAL | Status: AC
  Administered 2020-03-19 – 2020-03-20 (×7): 0.1 mg via ORAL
  Filled 2020-03-19 (×11): qty 1

## 2020-03-19 MED ORDER — ONDANSETRON 4 MG PO TBDP: 4.0000 mg | ORAL_TABLET | Freq: Four times a day (QID) | ORAL | Status: DC | PRN

## 2020-03-19 MED ORDER — CLONIDINE HCL 0.1 MG PO TABS
0.1000 mg | ORAL_TABLET | Freq: Every day | ORAL | Status: AC
  Administered 2020-03-23 – 2020-03-24 (×2): 0.1 mg via ORAL
  Filled 2020-03-19 (×2): qty 1

## 2020-03-19 MED ORDER — HYDROXYZINE HCL 25 MG PO TABS: 25.0000 mg | ORAL_TABLET | Freq: Three times a day (TID) | ORAL | Status: DC | PRN

## 2020-03-19 MED ORDER — LORAZEPAM 1 MG PO TABS
1.0000 mg | ORAL_TABLET | Freq: Four times a day (QID) | ORAL | Status: DC | PRN
  Administered 2020-03-19 – 2020-03-22 (×4): 1 mg via ORAL
  Filled 2020-03-19 (×4): qty 1

## 2020-03-19 MED ORDER — METHOCARBAMOL 500 MG PO TABS
500.0000 mg | ORAL_TABLET | Freq: Three times a day (TID) | ORAL | Status: DC | PRN
  Administered 2020-03-20 – 2020-03-23 (×5): 500 mg via ORAL
  Filled 2020-03-19 (×5): qty 1

## 2020-03-19 MED ORDER — LOPERAMIDE HCL 2 MG PO CAPS
2.0000 mg | ORAL_CAPSULE | ORAL | Status: DC | PRN
  Administered 2020-03-20: 2 mg via ORAL
  Filled 2020-03-19: qty 1

## 2020-03-19 MED ORDER — HYDROXYZINE HCL 25 MG PO TABS
25.0000 mg | ORAL_TABLET | Freq: Four times a day (QID) | ORAL | Status: DC | PRN
  Administered 2020-03-20 – 2020-03-23 (×5): 25 mg via ORAL
  Filled 2020-03-19 (×5): qty 1

## 2020-03-19 MED ORDER — CLONIDINE HCL 0.1 MG PO TABS
0.1000 mg | ORAL_TABLET | ORAL | Status: AC
  Administered 2020-03-21 – 2020-03-22 (×4): 0.1 mg via ORAL
  Filled 2020-03-19 (×4): qty 1

## 2020-03-19 MED ORDER — NICOTINE POLACRILEX 2 MG MT GUM
2.0000 mg | CHEWING_GUM | OROMUCOSAL | Status: DC | PRN
  Administered 2020-03-20 – 2020-03-25 (×18): 2 mg via ORAL
  Filled 2020-03-19 (×7): qty 1

## 2020-03-19 MED ORDER — ALUM & MAG HYDROXIDE-SIMETH 200-200-20 MG/5ML PO SUSP: 30.0000 mL | ORAL | Status: DC | PRN

## 2020-03-19 MED ORDER — ACETAMINOPHEN 325 MG PO TABS
650.0000 mg | ORAL_TABLET | Freq: Four times a day (QID) | ORAL | Status: DC | PRN
  Administered 2020-03-21 – 2020-03-22 (×2): 650 mg via ORAL
  Filled 2020-03-19 (×2): qty 2

## 2020-03-19 MED ORDER — NAPROXEN 500 MG PO TABS
500.0000 mg | ORAL_TABLET | Freq: Two times a day (BID) | ORAL | Status: DC | PRN
  Administered 2020-03-20: 500 mg via ORAL
  Filled 2020-03-19: qty 1

## 2020-03-19 MED ORDER — TRAZODONE HCL 50 MG PO TABS
50.0000 mg | ORAL_TABLET | Freq: Every evening | ORAL | Status: DC | PRN
  Administered 2020-03-19 – 2020-03-20 (×2): 50 mg via ORAL
  Filled 2020-03-19 (×2): qty 1

## 2020-03-19 MED ORDER — MAGNESIUM HYDROXIDE 400 MG/5ML PO SUSP: 30.0000 mL | Freq: Every day | ORAL | Status: DC | PRN

## 2020-03-19 MED ORDER — DICYCLOMINE HCL 20 MG PO TABS
20.0000 mg | ORAL_TABLET | Freq: Four times a day (QID) | ORAL | Status: DC | PRN
  Administered 2020-03-20: 20 mg via ORAL
  Filled 2020-03-19 (×2): qty 1

## 2020-03-19 MED ORDER — LORAZEPAM 1 MG PO TABS: 1.0000 mg | ORAL_TABLET | Freq: Four times a day (QID) | ORAL | Status: DC | PRN
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
## 2020-03-19 NOTE — Progress Notes (Signed)
This patient has been accepted to [facility], Room 306-01 to the service of MD Clary.  Patient may arrive after 0900 hours.  Report may be called to [phone] after transportation has been arranged.

## 2020-03-19 NOTE — ED Notes (Signed)
Pt shakes uncontrollably by appearance when staff in room. This Clinical research associate observed him not shaking when he did not think he was being observed. Pt reports that he is withdrawing from Fentanyl and methadone. Per nusing report the methadone clinic dropped him as a client because of noncompliance with their requirements.
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
## 2020-03-19 NOTE — Progress Notes (Signed)
Patient ID: Kaeson Kleinert, male   DOB: 10-Apr-1991, 29 y.o.   MRN: [id number]  [facility] NOVEL CORONAVIRUS (COVID-19) DAILY CHECK-OFF SYMPTOMS - answer yes or no to each - every day NO YES  Have you had a fever in the past 24 hours?  . Fever (Temp > 37.80C / 100F) X   Have you had any of these symptoms in the past 24 hours? . New Cough .  Sore Throat  .  Shortness of Breath .  Difficulty Breathing .  Unexplained Body Aches   X   Have you had any one of these symptoms in the past 24 hours not related to allergies?   . Runny Nose .  Nasal Congestion .  Sneezing   X   If you have had runny nose, nasal congestion, sneezing in the past 24 hours, has it worsened?  X   EXPOSURES - check yes or no X   Have you traveled outside the state in the past 14 days?  X   Have you been in contact with someone with a confirmed diagnosis of COVID-19 or PUI in the past 14 days without wearing appropriate PPE?  X   Have you been living in the same home as a person with confirmed diagnosis of COVID-19 or a PUI (household contact)?    X   Have you been diagnosed with COVID-19?    X              What to do next: Answered NO to all: Answered YES to anything:   Proceed with unit schedule Follow the [facility] Inpatient Flowsheet.

## 2020-03-19 NOTE — ED Notes (Signed)
Report given to Ethelene Browns RN at [facility] and [employer] called for report.

## 2020-03-19 NOTE — Progress Notes (Signed)
Adult Psychoeducational Group Note  Date:  03/19/2020 Time:  10:14 PM  Group Topic/Focus:  Wrap-Up Group:   The focus of this group is to help patients review their daily goal of treatment and discuss progress on daily workbooks.  Participation Level:  Active  Participation Quality:  Appropriate  Affect:  Appropriate  Cognitive:  Appropriate  Insight: Appropriate  Engagement in Group:  Engaged  Modes of Intervention:  Discussion  Additional Comments:  Pt attend wrap up group. His day was a 6. The one positive thing that happen he talked to his dad.  Charna Busman Long 03/19/2020, 10:14 PM

## 2020-03-19 NOTE — Tx Team (Signed)
Initial Treatment Plan 03/19/2020 11:43 AM [name] NIO:[id number]    PATIENT STRESSORS: Legal issue Substance abuse   PATIENT STRENGTHS: Ability for insight Motivation for treatment/growth Supportive family/friends   PATIENT IDENTIFIED PROBLEMS: Substance use  Depression  DUI charges                 DISCHARGE CRITERIA:  Improved stabilization in mood, thinking, and/or behavior Verbal commitment to aftercare and medication compliance Withdrawal symptoms are absent or subacute and managed without 24-hour nursing intervention  PRELIMINARY DISCHARGE PLAN: Outpatient therapy Return to previous living arrangement Return to previous work or school arrangements  PATIENT/FAMILY INVOLVEMENT: This treatment plan has been presented to and reviewed with the patient, Frederick Franklin, and/or family member.  The patient and family have been given the opportunity to ask questions and make suggestions.  Tania Ade, RN 03/19/2020, 11:43 AM

## 2020-03-19 NOTE — ED Notes (Signed)
Transported to [facility] by [employer]. Pt was friendly, calm and cooperative. One hospital bag of belongings and one bag brought by pt was given to [facility] officer to give to [facility] staff.

## 2020-03-19 NOTE — Plan of Care (Signed)
  Problem: Coping: Goal: Ability to verbalize frustrations and anger appropriately will improve Outcome: Progressing Goal: Ability to demonstrate self-control will improve Outcome: Progressing   Problem: Safety: Goal: Periods of time without injury will increase Outcome: Progressing   Problem: Activity: Goal: Interest or engagement in leisure activities will improve Outcome: Progressing   Problem: Coping: Goal: Coping ability will improve Outcome: Progressing Goal: Will verbalize feelings Outcome: Progressing   Problem: Health Behavior/Discharge Planning: Goal: Compliance with therapeutic regimen will improve Outcome: Progressing   Problem: Safety: Goal: Ability to disclose and discuss suicidal ideas will improve Outcome: Progressing
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
## 2020-03-19 NOTE — Progress Notes (Signed)
Patient presents pleasant and cooperative. Complaints of withdrawal and anxiety. Prn given with good relief. Patient given trazodone for sleep. Patient noted in dayroom laughing, and socializing with peers. Denies any SI, HI, AVH. Medication compliant. Appropriate with staff and peers. Encouragement and support provided. Safety checks maintained. Medications given as prescribed. Pt receptive and remains safe on unit with q 15 min checks.

## 2020-03-19 NOTE — ED Provider Notes (Signed)
Emergency Medicine Observation Re-evaluation Note  Frederick Franklin is a 29 y.o. male, seen on rounds today.  Pt initially presented to the ED for complaints of Medical Clearance (IVCd), Addiction Problem, and Psychiatric Evaluation Currently, the patient is awaiting psych disposition.  Physical Exam  BP (!) 139/93 (BP Location: Right Arm)   Pulse 83   Temp 98.1 F (36.7 C) (Oral)   Resp 16   Ht 6' (1.829 m)   Wt 113 kg   SpO2 99%   BMI 33.79 kg/m  Physical Exam General: No acute distress Cardiac: Good perfusion Lungs: Even unlabored respirations Psych: Calm and cooperative  ED Course / MDM  EKG:    I have reviewed the labs performed to date as well as medications administered while in observation.  Recent changes in the last 24 hours include patient accepted to [facility].  Plan  Current plan is for patient to go to [facility]. Dr. Jola Babinski is accepting.    Maia Plan, MD 03/20/20 [phone]

## 2020-03-19 NOTE — ED Notes (Signed)
patient requesting meds for withdraw, Horton MD notified, gave additional Vistaril

## 2020-03-19 NOTE — Progress Notes (Signed)
Patient ID: Frederick Franklin, male   DOB: September 06, 1990, 29 y.o.   MRN: [id number]  Pt alert and oriented during [facility] admission process. Pt denies SI/HI, A/VH, and any pain. Pt is cooperative and "glad to be here, I really need to be here." Education, support, reassurance, and encouragement provided, q15 minute safety checks initiated. Pt's belongings in locker #32. Pt denies any concerns at this time, and verbally contracts for safety. Pt ambulating on the unit with no issues. Pt remains safe on the unit.

## 2020-03-20 LAB — LIPID PANEL
Cholesterol: 145 mg/dL (ref 0–200)
HDL: 49 mg/dL (ref >40)
LDL Cholesterol: 81 mg/dL (ref 0–99)
Total CHOL/HDL Ratio: 3 RATIO
Triglycerides: 75 mg/dL (ref <150)
VLDL: 15 mg/dL (ref 0–40)

## 2020-03-20 LAB — HIV-1 RNA QUANT-NO REFLEX-BLD
HIV 1 RNA Quant: <20 copies/mL
LOG10 HIV-1 RNA: UNDETERMINED log10copy/mL

## 2020-03-20 LAB — TSH: TSH: 1.638 u[IU]/mL (ref 0.350–4.500)

## 2020-03-20 NOTE — Progress Notes (Signed)
Adult Psychoeducational Group Note  Date:  03/20/2020 Time:  12:02 PM  Group Topic/Focus:  Goals Group:   The focus of this group is to help patients establish daily goals to achieve during treatment and discuss how the patient can incorporate goal setting into their daily lives to aide in recovery.  Participation Level:  Active  Participation Quality:  Appropriate  Affect:  Appropriate  Cognitive:  Alert  Insight: Appropriate  Engagement in Group:  Engaged  Modes of Intervention:  Discussion  Additional Comments:  Pt attended group and participated in discussion.  Seyed Heffley R Meesha Sek 03/20/2020, 12:02 PM

## 2020-03-20 NOTE — Progress Notes (Signed)
[facility] Group Notes:  (Nursing/MHT/Case Management/Adjunct)  Date:  03/20/2020  Time:  2030  Type of Therapy:  wrap up group  Participation Level:  Active  Participation Quality:  Appropriate, Attentive, Sharing and Supportive  Affect:  Anxious and Excited  Cognitive:  Appropriate  Insight:  Improving  Engagement in Group:  Engaged  Modes of Intervention:  Clarification, Education and Support  Summary of Progress/Problems: Positive thinking and positive change were discussed.   Johann Capers S 03/20/2020, 10:14 PM
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
## 2020-03-20 NOTE — Progress Notes (Signed)
Pt is alert and oriented to person, place, time and situation. Pt is clam, cooperative, affect is flat, mood is depression, reports anxiety, denies suicidal and homicidal ideation, denies hallucinations. Pt participates in unit programming, c/o withdrawal symptoms, given PRN medications with good effect, see MAR for those details. Will continue to monitor pt per Q15 minute face checks and monitor for safety and progress. No distress noted, none reported.

## 2020-03-21 DIAGNOSIS — F1414 Cocaine abuse with cocaine-induced mood disorder: Secondary | ICD-10-CM

## 2020-03-21 DIAGNOSIS — F1120 Opioid dependence, uncomplicated: Secondary | ICD-10-CM

## 2020-03-21 MED ORDER — QUETIAPINE FUMARATE 25 MG PO TABS
25.0000 mg | ORAL_TABLET | Freq: Every day | ORAL | Status: DC
  Administered 2020-03-21: 25 mg via ORAL
  Filled 2020-03-21 (×2): qty 1

## 2020-03-21 NOTE — Progress Notes (Signed)
   03/20/20 2140  COVID-19 Daily Checkoff  Have you had a fever (temp > 37.80C/100F)  in the past 24 hours?  No  COVID-19 EXPOSURE  Have you traveled outside the state in the past 14 days? No  Have you been in contact with someone with a confirmed diagnosis of COVID-19 or PUI in the past 14 days without wearing appropriate PPE? No  Have you been living in the same home as a person with confirmed diagnosis of COVID-19 or a PUI (household contact)? No  Have you been diagnosed with COVID-19? No

## 2020-03-21 NOTE — Progress Notes (Signed)
Pt c/o withdrawal symptoms. Pt reports using IV fentanyl and hydromorphone prior to admission and crack cocaine. Pt's UDS was positive for benzos and THC. Pt was administered his bentyl for abdominal cramping, imodium for diarrhea, robaxin for muscle spasms, trazadone for insomnia, and ativan for anxiety (being restless and fidgety). Pt said his goal is to stay sober after admission and is looking forward to attending rehab. Pt denies SI/HI and AVH. Active listening, reassurance, and support provided. Q 15 min safety checks continue. Pt's safety has been maintained.    03/20/20 2140  Psych Admission Type (Psych Patients Only)  Admission Status Involuntary  Psychosocial Assessment  Patient Complaints Anxiety;Substance abuse  Eye Contact Fair  Facial Expression Anxious  Affect Appropriate to circumstance;Anxious  Speech Unremarkable  Interaction Assertive  Motor Activity Fidgety;Restless  Appearance/Hygiene Unremarkable  Behavior Characteristics Cooperative;Anxious;Appropriate to situation  Mood Anxious;Pleasant  Thought Process  Coherency WDL  Content WDL  Delusions None reported or observed  Perception WDL  Hallucination None reported or observed  Judgment Impaired  Confusion None  Danger to Self  Current suicidal ideation? Denies  Danger to Others  Danger to Others None reported or observed

## 2020-03-21 NOTE — Tx Team (Signed)
Interdisciplinary Treatment and Diagnostic Plan Update  03/21/2020 Time of Session: 0938 Frederick Franklin MRN: [id number]  Principal Diagnosis: <principal problem not specified>  Secondary Diagnoses: Active Problems:   Severe recurrent major depression without psychotic features (HCC)   Current Medications:  Current Facility-Administered Medications  Medication Dose Route Frequency Provider Last Rate Last Admin  . acetaminophen (TYLENOL) tablet 650 mg  650 mg Oral Q6H PRN Nira Conn A, NP   650 mg at 03/21/20 1143  . alum & mag hydroxide-simeth (MAALOX/MYLANTA) 200-200-20 MG/5ML suspension 30 mL  30 mL Oral Q4H PRN Nira Conn A, NP      . cloNIDine (CATAPRES) tablet 0.1 mg  0.1 mg Oral BH-qamhs Antonieta Pert, MD   0.1 mg at 03/21/20 0746   Followed by  . [START ON 03/23/2020] cloNIDine (CATAPRES) tablet 0.1 mg  0.1 mg Oral QAC breakfast Antonieta Pert, MD      . dicyclomine (BENTYL) tablet 20 mg  20 mg Oral Q6H PRN Antonieta Pert, MD   20 mg at 03/20/20 2141  . hydrOXYzine (ATARAX/VISTARIL) tablet 25 mg  25 mg Oral Q6H PRN Antonieta Pert, MD   25 mg at 03/21/20 1143  . loperamide (IMODIUM) capsule 2-4 mg  2-4 mg Oral PRN Antonieta Pert, MD   2 mg at 03/20/20 2140  . LORazepam (ATIVAN) tablet 1 mg  1 mg Oral Q6H PRN Karsten Ro, MD   1 mg at 03/20/20 2141  . magnesium hydroxide (MILK OF MAGNESIA) suspension 30 mL  30 mL Oral Daily PRN Nira Conn A, NP      . methocarbamol (ROBAXIN) tablet 500 mg  500 mg Oral Q8H PRN Antonieta Pert, MD   500 mg at 03/20/20 2141  . naproxen (NAPROSYN) tablet 500 mg  500 mg Oral BID PRN Antonieta Pert, MD   500 mg at 03/20/20 1225  . nicotine polacrilex (NICORETTE) gum 2 mg  2 mg Oral PRN Karsten Ro, MD   2 mg at 03/21/20 1047  . ondansetron (ZOFRAN-ODT) disintegrating tablet 4 mg  4 mg Oral Q6H PRN Antonieta Pert, MD      . pantoprazole (PROTONIX) EC tablet 40 mg  40 mg Oral Daily Antonieta Pert, MD   40 mg at 03/21/20  0746  . QUEtiapine (SEROQUEL) tablet 25 mg  25 mg Oral QHS Doda, Vandana, MD      . traZODone (DESYREL) tablet 50 mg  50 mg Oral QHS PRN,MR X 1 Nira Conn A, NP   50 mg at 03/20/20 2141   PTA Medications: Medications Prior to Admission  Medication Sig Dispense Refill Last Dose  . acetaminophen (TYLENOL) 500 MG tablet Take 1 tablet (500 mg total) by mouth every 6 (six) hours as needed. (Patient not taking: Reported on 03/18/2020) 30 tablet 0   . diphenhydrAMINE (BENADRYL) 25 MG tablet Take 1 tablet (25 mg total) by mouth every 6 (six) hours as needed for itching. (Patient not taking: Reported on 03/18/2020) 30 tablet 0   . famotidine (PEPCID) 20 MG tablet Take 1 tablet (20 mg total) by mouth 2 (two) times daily. (Patient not taking: Reported on 03/18/2020) 28 tablet 0   . hydrocortisone cream 1 % Apply to affected area 2 times daily on face (Patient not taking: Reported on 03/18/2020) 15 g 0   . methadone (DOLOPHINE) 1 MG/1ML solution Take 26 mg by mouth daily.     . predniSONE (DELTASONE) 10 MG tablet 3 tabs po daily x 5  days, then 2 tabs x 5 days, then 1 tab x 5 days (Patient not taking: Reported on 03/18/2020) 30 tablet 0   . triamcinolone ointment (KENALOG) 0.5 % Apply 1 application topically 2 (two) times daily. On body and extremities, NOT face (Patient not taking: Reported on 03/18/2020) 30 g 3     Patient Stressors: Legal issue Substance abuse  Patient Strengths: Ability for insight Motivation for treatment/growth Supportive family/friends  Treatment Modalities: Medication Management, Group therapy, Case management,  1 to 1 session with clinician, Psychoeducation, Recreational therapy.   Physician Treatment Plan for Primary Diagnosis: <principal problem not specified> Long Term Goal(s): Improvement in symptoms so as ready for discharge Improvement in symptoms so as ready for discharge   Short Term Goals: Ability to identify changes in lifestyle to reduce recurrence of condition will  improve Ability to verbalize feelings will improve Ability to disclose and discuss suicidal ideas Ability to demonstrate self-control will improve Ability to identify and develop effective coping behaviors will improve Ability to maintain clinical measurements within normal limits will improve Compliance with prescribed medications will improve Ability to identify triggers associated with substance abuse/mental health issues will improve Ability to identify changes in lifestyle to reduce recurrence of condition will improve Ability to verbalize feelings will improve Ability to disclose and discuss suicidal ideas Ability to demonstrate self-control will improve Ability to identify and develop effective coping behaviors will improve Ability to maintain clinical measurements within normal limits will improve Compliance with prescribed medications will improve Ability to identify triggers associated with substance abuse/mental health issues will improve  Medication Management: Evaluate patient's response, side effects, and tolerance of medication regimen.  Therapeutic Interventions: 1 to 1 sessions, Unit Group sessions and Medication administration.  Evaluation of Outcomes: Progressing  Physician Treatment Plan for Secondary Diagnosis: Active Problems:   Severe recurrent major depression without psychotic features (HCC)  Long Term Goal(s): Improvement in symptoms so as ready for discharge Improvement in symptoms so as ready for discharge   Short Term Goals: Ability to identify changes in lifestyle to reduce recurrence of condition will improve Ability to verbalize feelings will improve Ability to disclose and discuss suicidal ideas Ability to demonstrate self-control will improve Ability to identify and develop effective coping behaviors will improve Ability to maintain clinical measurements within normal limits will improve Compliance with prescribed medications will improve Ability  to identify triggers associated with substance abuse/mental health issues will improve Ability to identify changes in lifestyle to reduce recurrence of condition will improve Ability to verbalize feelings will improve Ability to disclose and discuss suicidal ideas Ability to demonstrate self-control will improve Ability to identify and develop effective coping behaviors will improve Ability to maintain clinical measurements within normal limits will improve Compliance with prescribed medications will improve Ability to identify triggers associated with substance abuse/mental health issues will improve     Medication Management: Evaluate patient's response, side effects, and tolerance of medication regimen.  Therapeutic Interventions: 1 to 1 sessions, Unit Group sessions and Medication administration.  Evaluation of Outcomes: Progressing   RN Treatment Plan for Primary Diagnosis: <principal problem not specified> Long Term Goal(s): Knowledge of disease and therapeutic regimen to maintain health will improve  Short Term Goals: Ability to remain free from injury will improve, Ability to disclose and discuss suicidal ideas, Ability to identify and develop effective coping behaviors will improve and Compliance with prescribed medications will improve  Medication Management: RN will administer medications as ordered by provider, will assess and evaluate patient's response and  provide education to patient for prescribed medication. RN will report any adverse and/or side effects to prescribing provider.  Therapeutic Interventions: 1 on 1 counseling sessions, Psychoeducation, Medication administration, Evaluate responses to treatment, Monitor vital signs and CBGs as ordered, Perform/monitor CIWA, COWS, AIMS and Fall Risk screenings as ordered, Perform wound care treatments as ordered.  Evaluation of Outcomes: Progressing   LCSW Treatment Plan for Primary Diagnosis: <principal problem not  specified> Long Term Goal(s): Safe transition to appropriate next level of care at discharge, Engage patient in therapeutic group addressing interpersonal concerns.  Short Term Goals: Engage patient in aftercare planning with referrals and resources, Increase ability to appropriately verbalize feelings, Increase emotional regulation and Increase skills for wellness and recovery  Therapeutic Interventions: Assess for all discharge needs, 1 to 1 time with Social worker, Explore available resources and support systems, Assess for adequacy in community support network, Educate family and significant other(s) on suicide prevention, Complete Psychosocial Assessment, Interpersonal group therapy.  Evaluation of Outcomes: Progressing   Progress in Treatment: Attending groups: Yes. Participating in groups: Yes. Taking medication as prescribed: Yes. Toleration medication: Yes. Family/Significant other contact made: No, will contact:  mother. Patient understands diagnosis: Yes. Discussing patient identified problems/goals with staff: Yes. Medical problems stabilized or resolved: Yes. Denies suicidal/homicidal ideation: Yes. Issues/concerns per patient self-inventory: No. Other: N/A  New problem(s) identified: No, Describe:  None noted.  New Short Term/Long Term Goal(s): Safe transition to appropriate next level of care at discharge, Engage patient in therapeutic group addressing interpersonal concerns.  Patient Goals:  "Total sobriety  Discharge Plan or Barriers: Pt to return to family. Pt to follow up with recommended level of care and medication management services.  Reason for Continuation of Hospitalization: Anxiety Depression Medication stabilization Withdrawal symptoms  Estimated Length of Stay: 3-5 days  Attendees: Patient: Frederick Franklin 03/21/2020 12:58 PM  Physician: Dr. Jola Babinski, MD 03/21/2020 12:58 PM  Nursing:  03/21/2020 12:58 PM  RN Care Manager: 03/21/2020 12:58 PM  Social Worker:  Cyril Loosen, LCSW 03/21/2020 12:58 PM  Recreational Therapist:  03/21/2020 12:58 PM  Other:  03/21/2020 12:58 PM  Other:  03/21/2020 12:58 PM  Other: 03/21/2020 12:58 PM    Scribe for Treatment Team: Leisa Lenz, LCSW 03/21/2020 12:58 PM

## 2020-03-21 NOTE — [facility] Counselor (Signed)
CSW completed and faxed referral to [facility] for residential substance use treatment.   Ruthann Cancer MSW, LCSW Clincal Social Worker  [facility]

## 2020-03-21 NOTE — [facility] Counselor (Signed)
CSW completed and faxed referral to [facility] for residential substance use treatment. This patients authorization # is [id number] and is valid from 03/21/2020 to 03/27/2020.    Ruthann Cancer MSW, LCSW Clincal Social Worker  [facility]
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
## 2020-03-21 NOTE — Progress Notes (Signed)
Patient rated his day as an 8 out of a possible 10 since he states that he is no longer detox ing from drugs. He also reports feeling better in general. His goal for tomorrow is to find out more about his discharge plans.

## 2020-03-21 NOTE — Plan of Care (Signed)
Progress note  D: pt found in bed; compliant with medication administration. Pt has complaints of anxiety, generalized body aches, and being fidgety. Pt is pleasant but seems anxious/worried about their discharge plan. Pt states they want to find an inpatient treatment center but feel they may not get in because of Covid and them being "backed up". Pt has been reclusive to their room, but is animated with their roommate when interacting. Pt denies si/hi/ah/vh and verbally agrees to approach staff if these become apparent or before harming themself/others while at [facility].  A: Pt provided support and encouragement. Pt given medication per protocol and standing orders. Q57m safety checks implemented and continued.  R: Pt safe on the unit. Will continue to monitor.  Pt progressing in the following metrics  Problem: Education: Goal: Knowledge of [facility] Education information/materials will improve Outcome: Progressing Goal: Emotional status will improve Outcome: Progressing Goal: Mental status will improve Outcome: Progressing Goal: Verbalization of understanding the information provided will improve Outcome: Progressing

## 2020-03-22 ENCOUNTER — Inpatient Hospital Stay: Payer: Federal, State, Local not specified - Other

## 2020-03-22 DIAGNOSIS — F1414 Cocaine abuse with cocaine-induced mood disorder: Secondary | ICD-10-CM

## 2020-03-22 DIAGNOSIS — F1310 Sedative, hypnotic or anxiolytic abuse, uncomplicated: Secondary | ICD-10-CM

## 2020-03-22 DIAGNOSIS — Z23 Encounter for immunization: Secondary | ICD-10-CM

## 2020-03-22 MED ORDER — QUETIAPINE FUMARATE 200 MG PO TABS
200.0000 mg | ORAL_TABLET | Freq: Every day | ORAL | Status: DC
  Administered 2020-03-22 – 2020-03-24 (×3): 200 mg via ORAL
  Filled 2020-03-22: qty 1
  Filled 2020-03-22: qty 7
  Filled 2020-03-22 (×3): qty 1
  Filled 2020-03-22: qty 7
  Filled 2020-03-22 (×2): qty 1

## 2020-03-22 MED ORDER — HYDROXYZINE HCL 25 MG PO TABS
50.0000 mg | ORAL_TABLET | Freq: Every day | ORAL | Status: DC
  Administered 2020-03-22 – 2020-03-24 (×3): 50 mg via ORAL
  Filled 2020-03-22: qty 1
  Filled 2020-03-22: qty 7
  Filled 2020-03-22: qty 1
  Filled 2020-03-22: qty 14
  Filled 2020-03-22 (×2): qty 1

## 2020-03-22 MED ORDER — LURASIDONE HCL 40 MG PO TABS
40.0000 mg | ORAL_TABLET | Freq: Every day | ORAL | Status: DC
  Filled 2020-03-22 (×2): qty 1

## 2020-03-22 NOTE — Progress Notes (Signed)
   03/22/20 0000  Psych Admission Type (Psych Patients Only)  Admission Status Involuntary  Psychosocial Assessment  Patient Complaints Anxiety  Eye Contact Fair  Facial Expression Anxious;Pensive;Sullen;Sad  Affect Anxious;Sad;Sullen  Furniture conservator/restorer;Restless;Slow  Appearance/Hygiene Unremarkable  Behavior Characteristics Cooperative  Mood Depressed  Thought Process  Coherency WDL  Content Blaming self  Delusions None reported or observed  Perception WDL  Hallucination None reported or observed  Judgment Poor  Confusion None  Danger to Self  Current suicidal ideation? Denies  Danger to Others  Danger to Others None reported or observed

## 2020-03-22 NOTE — Progress Notes (Signed)
Recreation Therapy Notes  Date: 9.3.21 Time: 0930 Location: [address] Dayroom  Group Topic: Stress Management  Goal Area(s) Addresses:  Patient will identify positive stress management techniques. Patient will identify benefits of using stress management post d/c.  Behavioral Response: Engaged  Intervention: Stress Management  Activity:  Guided Imagery.  LRT read a script that focused on relaxing in a peaceful meadow.  Patients were to listen and follow along as script was read to fully engage.    Education:  Stress Management, Discharge Planning.   Education Outcome: Acknowledges Education  Clinical Observations/Feedback: Pt attended and participated in activity.    Caroll Rancher, LRT/CTRS         Caroll Rancher A 03/22/2020 11:45 AM

## 2020-03-22 NOTE — Progress Notes (Signed)
   03/22/20 1031  Psych Admission Type (Psych Patients Only)  Admission Status Involuntary  Psychosocial Assessment  Patient Complaints Anxiety;Irritability  Eye Contact Fair  Facial Expression Anxious;Worried  Affect Appropriate to circumstance  Furniture conservator/restorer  Appearance/Hygiene Unremarkable  Behavior Characteristics Cooperative  Mood Anxious;Depressed  Thought Process  Coherency WDL  Content Blaming self  Delusions None reported or observed  Perception WDL  Hallucination None reported or observed  Judgment Limited  Confusion None  Danger to Self  Current suicidal ideation? Denies  Danger to Others  Danger to Others None reported or observed

## 2020-03-22 NOTE — Progress Notes (Signed)
Patient did attend the evening speaker AA meeting.  

## 2020-03-22 NOTE — Progress Notes (Signed)
   Covid-19 Vaccination Clinic  Name:  Ithan Touhey    MRN: [id number] DOB: 12/08/1990  03/22/2020  Mr. Battiste was observed post Covid-19 immunization for 15 minutes without incident. He was provided with Vaccine Information Sheet and instruction to access the V-Safe system.   Mr. Pollitt was instructed to call 911 with any severe reactions post vaccine: Marland Kitchen Difficulty breathing  . Swelling of face and throat  . A fast heartbeat  . A bad rash all over body  . Dizziness and weakness   Immunizations Administered    Name Date Dose VIS Date Route   Pfizer COVID-19 Vaccine 03/22/2020  1:27 PM 0.3 mL 09/13/2018 Intramuscular   Manufacturer: [employer], [employer]   Lot: [id number]   NDC: [address]-5
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
## 2020-03-23 DIAGNOSIS — F1910 Other psychoactive substance abuse, uncomplicated: Secondary | ICD-10-CM

## 2020-03-23 DIAGNOSIS — F1310 Sedative, hypnotic or anxiolytic abuse, uncomplicated: Secondary | ICD-10-CM

## 2020-03-23 NOTE — [facility] Group Notes (Signed)
LCSW Group Therapy Note  03/23/2020    11:00am-12:00pm  Type of Therapy and Topic:  Group Therapy: Anger and Coping Skills  Participation Level:  Active   Description of Group:   In this group, patients learned how to recognize the physical, cognitive, emotional, and behavioral responses they have to anger-provoking situations.  They identified how they usually or often react when angered, and learned how healthy and unhealthy coping skills work initially, but the unhealthy ones stop working.   They analyzed how their frequently-chosen coping skill is possibly beneficial and how it is possibly unhelpful.  The group discussed a variety of healthier coping skills that could help in resolving the actual issues, as well as how to go about planning for the the possibility of future similar situations.  Therapeutic Goals: 1. Patients will identify one thing that makes them angry and how they feel emotionally and physically, what their thoughts are or tend to be in those situations, and what healthy or unhealthy coping mechanism they typically use 2. Patients will identify how their coping technique works for them, as well as how it works against them. 3. Patients will explore possible new behaviors to use in future anger situations. 4. Patients will learn that anger itself is normal and cannot be eliminated, and that healthier coping skills can assist with resolving conflict rather than worsening situations.  Summary of Patient Progress:  The patient shared that he often is angered by "lack of common sense."  He agreed with what a number of other people shared as they talked about ignorance, lying, and other things that can create angry feelings.  He seemed amused through most of group.  He interacted well.  Therapeutic Modalities:   Cognitive Behavioral Therapy Motivation Interviewing  Lynnell Chad  .

## 2020-03-23 NOTE — BHH Group Notes (Signed)
Adult Psychoeducational Group Note  Date:  03/23/2020 Time:  11:12 AM  Group Topic/Focus:  Goals Group:   The focus of this group is to help patients establish daily goals to achieve during treatment and discuss how the patient can incorporate goal setting into their daily lives to aide in recovery.  Participation Level:  Active  Participation Quality:  Appropriate  Affect:  Appropriate  Cognitive:  Appropriate  Insight: Appropriate  Engagement in Group:  Engaged  Modes of Intervention:  Discussion, Education, Exploration and Support  Additional Comments:  Pt rates his energy as a 5/10.  Goal is to talk with the Doctor about diischarge plans  Dione Housekeeper 03/23/2020, 11:12 AM

## 2020-03-23 NOTE — Plan of Care (Signed)
Pt reports he slept good last night.
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
## 2020-03-23 NOTE — Progress Notes (Signed)
   03/23/20 2100  Psych Admission Type (Psych Patients Only)  Admission Status Involuntary  Psychosocial Assessment  Patient Complaints Anxiety;Depression  Eye Contact Fair  Facial Expression Anxious  Affect Appropriate to circumstance  Speech Logical/coherent  Interaction Assertive  Motor Activity Fidgety  Appearance/Hygiene Unremarkable  Behavior Characteristics Cooperative  Mood Anxious  Thought Process  Coherency WDL  Content WDL  Delusions None reported or observed  Perception WDL  Hallucination None reported or observed  Judgment Poor  Confusion None  Danger to Self  Current suicidal ideation? Denies  Danger to Others  Danger to Others None reported or observed

## 2020-03-23 NOTE — Progress Notes (Signed)
°   03/23/20 1300  Psych Admission Type (Psych Patients Only)  Admission Status Involuntary  Psychosocial Assessment  Patient Complaints Anxiety;Depression;Irritability  Eye Contact Fair  Facial Expression Anxious  Affect Appropriate to circumstance  Speech Logical/coherent  Interaction Assertive  Motor Activity Fidgety  Appearance/Hygiene Unremarkable  Behavior Characteristics Cooperative  Mood Anxious  Thought Process  Coherency WDL  Content WDL  Delusions None reported or observed  Perception WDL  Hallucination None reported or observed  Judgment Poor  Confusion None  Danger to Self  Current suicidal ideation? Denies  Danger to Others  Danger to Others None reported or observed   Pt has been observed out in the dayroom playing cards and interacting with peers. He reports sleeping well and c/o anxiety, depression and mucsle spasms this afternoon. He denied any withdrawal symptoms this morning. Offered support, encouragement and 15 minute checks. Safety maintained on the unit.

## 2020-03-23 NOTE — [facility] Suicide Risk Assessment (Signed)
[facility] INPATIENT:  Family/Significant Other Suicide Prevention Education  Suicide Prevention Education:  Education Completed; mother, Frederick Franklin [phone] has been identified by the patient as the family member/significant other with whom the patient will be residing, and identified as the person(s) who will aid the patient in the event of a mental health crisis (suicidal ideations/suicide attempt).  With written consent from the patient, the family member/significant other has been provided the following suicide prevention education, prior to the and/or following the discharge of the patient.  The suicide prevention education provided includes the following:  Suicide risk factors  Suicide prevention and interventions  National Suicide Hotline telephone number  [facility] assessment telephone number  [facility] Emergency Assistance 911  [facility] and/or Residential Mobile Crisis Unit telephone number  Request made of family/significant other to:  Remove weapons (e.g., guns, rifles, knives), all items previously/currently identified as safety concern.    Remove drugs/medications (over-the-counter, prescriptions, illicit drugs), all items previously/currently identified as a safety concern.  The family member/significant other verbalizes understanding of the suicide prevention education information provided.  The family member/significant other agrees to remove the items of safety concern listed above.   Pt lives with mother, there are no firearms in the home or weapons to mother's knowledge. Mother does wonder if there are drugs hidden and she worries about pt relapsing after discharge. Mother shares pt has struggled with substance use and addiction for the last 10 years. Mother reports pt will have court dates related to his DUI.  Mother was hopeful for pt to enter residential substance use treatment after [facility], but she is aware this is unlikely due to waitlists for  treatment beds. Mother hopes pt can be accepted to [facility], reports pt was previously inpatient for treatment there and did well for a period of time. Mother has concerns about transportation if pt is discharged and is offered a treatment bed afterward. Mother reports she has no transportation and no income.  Mother plans to discuss options with pt's father (they are divorced but cordial). Mother was appreciative of call and updates.  Frederick Franklin 03/23/2020, 11:49 AM
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
## 2020-03-24 NOTE — Progress Notes (Signed)
   03/24/20 2306  COVID-19 Daily Checkoff  Have you had a fever (temp > 37.80C/100F)  in the past 24 hours?  No  If you have had runny nose, nasal congestion, sneezing in the past 24 hours, has it worsened? No  COVID-19 EXPOSURE  Have you traveled outside the state in the past 14 days? No  Have you been in contact with someone with a confirmed diagnosis of COVID-19 or PUI in the past 14 days without wearing appropriate PPE? No  Have you been living in the same home as a person with confirmed diagnosis of COVID-19 or a PUI (household contact)? No  Have you been diagnosed with COVID-19? No

## 2020-03-24 NOTE — [facility] Group Notes (Signed)
[facility] LCSW Group Therapy Note  03/24/2020    Type of Therapy and Topic:  Group Therapy:  A Hero Worthy of Support  Participation Level:  Active   Description of Group:  Patients in this group were introduced to the concept that additional supports including self-support are an essential part of recovery.  Matching needs with supports to help fulfill those needs was explained.  Establishing boundaries that can gradually be increased or decreased was described, with patients giving their own examples of establishing appropriate boundaries in their lives.  A song entitled "My Own Hero" was played and a group discussion ensued in which patients stated it inspired them to help themselves in order to succeed, because other people cannot achieve their goals such as sobriety or stability for them.  A song was played called "I Am Enough" which led to a discussion about being willing to believe we are worth the effort of being a self-support.   Therapeutic Goals: 1)  demonstrate the importance of being a key part of one's own support system 2)  discuss various available supports 3)  encourage patient to use music as part of their self-support and focus on goals 4)  elicit ideas from patients about supports that need to be added   Summary of Patient Progress:  The patient expressed at the beginning of group some frustration that he has not been told when he will discharge, then when told what was discussed in rounds, he became frustrated, reporting that this is the 3rd time he has been told different answers. The healthy supports in his life include his mother and dog, while the unhealthy supports include people he hangs out with and his father.  Therapeutic Modalities:   Motivational Interviewing Activity  Lynnell Chad

## 2020-03-24 NOTE — Progress Notes (Signed)
   03/24/20 2308  Psych Admission Type (Psych Patients Only)  Admission Status Involuntary  Psychosocial Assessment  Patient Complaints Anxiety  Eye Contact Fair  Facial Expression Anxious  Affect Appropriate to circumstance  Speech Logical/coherent  Interaction Assertive  Motor Activity Restless;Fidgety  Appearance/Hygiene Unremarkable  Behavior Characteristics Appropriate to situation  Mood Anxious;Pleasant  Thought Process  Coherency WDL  Content WDL  Delusions None reported or observed  Perception WDL  Hallucination None reported or observed  Judgment Poor  Confusion None  Danger to Self  Current suicidal ideation? Denies  Danger to Others  Danger to Others None reported or observed

## 2020-03-24 NOTE — Progress Notes (Signed)
[facility] MD Progress Note  03/24/2020 12:46 PM Frederick Franklin  MRN:  [id number] Subjective: " I wished I have another inpatient facility to go direct from here to when I leave" Patient remains worried about going to unsafe place and relapsing.  He wishes he could be accepted to  A long term treatment facility.  He denies withdrawal symptoms, he states that he is willing to stop using any illicit/illegal drugs but cannot do it without getting into a long term treatment facility.  His plan is to get back to work and make sure he keeps busy with AA/NA meetings.  He plans to be calling long term substance treatment places to see if he could be accepted.  Patient reports good sleep and appetite, reports that he is talking to his dad and mom but no plans to move in with them.  He denies suicide ideation, no AVH and no paranoia. Principal Problem: Cocaine abuse with cocaine-induced mood disorder (HCC) Diagnosis: Principal Problem:   Cocaine abuse with cocaine-induced mood disorder (HCC) Active Problems:   Opioid use disorder, severe, dependence (HCC)   Polysubstance abuse (HCC)   Severe recurrent major depression without psychotic features (HCC)   Benzodiazepine abuse (HCC)  Total Time spent with patient: 20 minutes  Past Psychiatric History: Polysubstance use , Anxiety  Pt reports h/o Bipolar depression  Past Medical History: History reviewed. No pertinent past medical history. History reviewed. No pertinent surgical history. Family History: History reviewed. No pertinent family history. Family Psychiatric  History: Grandfather -Addicted to drugs. Social History:  Social History   Substance and Sexual Activity  Alcohol Use No     Social History   Substance and Sexual Activity  Drug Use Never    Social History   Socioeconomic History   Marital status: Single    Spouse name: Not on file   Number of children: Not on file   Years of education: Not on file   Highest education level: Not on  file  Occupational History   Not on file  Tobacco Use   Smoking status: Former Smoker   Smokeless tobacco: Never Used  Building services engineer Use: Every day  Substance and Sexual Activity   Alcohol use: No   Drug use: Never   Sexual activity: Not on file  Other Topics Concern   Not on file  Social History Narrative   Not on file   Social Determinants of Health   Financial Resource Strain:    Difficulty of Paying Living Expenses: Not on file  Food Insecurity:    Worried About Programme researcher, broadcasting/film/video in the Last Year: Not on file   [employer] of Food in the Last Year: Not on file  Transportation Needs:    Lack of Transportation (Medical): Not on file   Lack of Transportation (Non-Medical): Not on file  Physical Activity:    Days of Exercise per Week: Not on file   Minutes of Exercise per Session: Not on file  Stress:    Feeling of Stress : Not on file  Social Connections:    Frequency of Communication with Friends and Family: Not on file   Frequency of Social Gatherings with Friends and Family: Not on file   Attends Religious Services: Not on file   Active Member of Clubs or Organizations: Not on file   Attends Banker Meetings: Not on file   Marital Status: Not on file   Additional Social History:  Sleep: Fair  Appetite:  Good  Current Medications: Current Facility-Administered Medications  Medication Dose Route Frequency Provider Last Rate Last Admin   acetaminophen (TYLENOL) tablet 650 mg  650 mg Oral Q6H PRN Nira Conn A, NP   650 mg at 03/22/20 1544   alum & mag hydroxide-simeth (MAALOX/MYLANTA) 200-200-20 MG/5ML suspension 30 mL  30 mL Oral Q4H PRN Nira Conn A, NP       hydrOXYzine (ATARAX/VISTARIL) tablet 50 mg  50 mg Oral QHS Karsten Ro, MD   50 mg at 03/23/20 2117   magnesium hydroxide (MILK OF MAGNESIA) suspension 30 mL  30 mL Oral Daily PRN Nira Conn A, NP       nicotine  polacrilex (NICORETTE) gum 2 mg  2 mg Oral PRN Karsten Ro, MD   2 mg at 03/24/20 1245   pantoprazole (PROTONIX) EC tablet 40 mg  40 mg Oral Daily Antonieta Pert, MD   40 mg at 03/24/20 0743   QUEtiapine (SEROQUEL) tablet 200 mg  200 mg Oral QHS Karsten Ro, MD   200 mg at 03/23/20 2117    Lab Results: No results found for this or any previous visit (from the past 48 hour(s)).  Blood Alcohol level:  Lab Results  Component Value Date   ETH <10 03/16/2020   ETH <10 03/14/2020    Metabolic Disorder Labs: Lab Results  Component Value Date   HGBA1C 5.2 03/19/2020   MPG 102.54 03/19/2020   No results found for: PROLACTIN Lab Results  Component Value Date   CHOL 145 03/20/2020   TRIG 75 03/20/2020   HDL 49 03/20/2020   CHOLHDL 3.0 03/20/2020   VLDL 15 03/20/2020   LDLCALC 81 03/20/2020    Physical Findings: AIMS: Facial and Oral Movements Muscles of Facial Expression: None, normal Lips and Perioral Area: None, normal Jaw: None, normal Tongue: None, normal,Extremity Movements Upper (arms, wrists, hands, fingers): None, normal Lower (legs, knees, ankles, toes): None, normal, Trunk Movements Neck, shoulders, hips: None, normal, Overall Severity Severity of abnormal movements (highest score from questions above): None, normal Incapacitation due to abnormal movements: None, normal Patient's awareness of abnormal movements (rate only patient's report): No Awareness, Dental Status Current problems with teeth and/or dentures?: No Does patient usually wear dentures?: No  CIWA:  CIWA-Ar Total: 22 COWS:  COWS Total Score: 3  Musculoskeletal: Strength & Muscle Tone: within normal limits Gait & Station: normal Patient leans: N/A  Psychiatric Specialty Exam: Physical Exam Vitals and nursing note reviewed.  Constitutional:      General: He is not in acute distress.    Appearance: Normal appearance. He is not ill-appearing, toxic-appearing or diaphoretic.  HENT:      Head: Normocephalic and atraumatic.  Pulmonary:     Effort: Pulmonary effort is normal.  Neurological:     General: No focal deficit present.     Mental Status: He is alert and oriented to person, place, and time.     Review of Systems  Constitutional: Negative for appetite change, fatigue and fever.  Eyes: Negative for visual disturbance.  Respiratory: Negative for chest tightness and shortness of breath.   Cardiovascular: Negative for chest pain and palpitations.  Gastrointestinal: Negative for abdominal pain, constipation, diarrhea, nausea and vomiting.  Neurological: Negative for dizziness, light-headedness and headaches.  Psychiatric/Behavioral: Negative for suicidal ideas.    Blood pressure 121/87, pulse (!) 101, temperature 98.6 F (37 C), temperature source Oral, resp. rate 18, height 6' (1.829 m), weight 110 kg, SpO2 98 %.Body  mass index is 32.89 kg/m.  General Appearance: Casual  Eye Contact:  Good  Speech:  Normal Rate  Volume:  Normal  Mood:  Euthymic and less anxious  Affect:  Congruent  Thought Process:  Coherent  Orientation:  Full (Time, Place, and Person)  Thought Content:  Logical  Suicidal Thoughts:  No  Homicidal Thoughts:  No  Memory:  Immediate;   Fair Recent;   Fair Remote;   Fair  Judgement:  Fair  Insight:  Fair  Psychomotor Activity:  Normal  Concentration:  Concentration: Good and Attention Span: Good  Recall:  Fair  Fund of Knowledge:  Good  Language:  Good  Akathisia:  Negative  Handed:  Right  AIMS (if indicated):     Assets:  Desire for Improvement Resilience Social Support  ADL's:  Intact  Cognition:  WNL  Sleep:  Number of Hours: 5     Treatment Plan Summary: Dx-  Cocaine use Disorder with Cocaine induced Mood Disorder Polysubstance Abuse Opioid depencence Benzodiazepine abuse  Plan- Daily contact with patient to assess and evaluate symptoms and progress in treatment -Monitor Vitals. -Monitor for Suicidal  Ideation-Denies -Monitor for withdrawal symptoms- Denies -Monitor for medication side effects. -Continue  Seroquel  200 mg  - Nicorette Gum 2 mg PRN for smoking withdrawal effects.  -Protonix 40 mg oral daily for GI Discomfort -Hydroxyzine 50 mg po at bed time for sleep/anxiety - CSW to continue looking for  long term Rehab places in [facility], NP 03/24/2020, 12:46 PM

## 2020-03-24 NOTE — [facility] Group Notes (Signed)
Adult Psychoeducational Group Not Date:  03/24/2020 Time:  9:00am-9:45am  Group Topic/Focus: PROGRESSIVE RELAXATION. A group where deep breathing is taught and tensing and relaxation muscle groups is used. Imagery is used as well.  Pts are asked to imagine 3 pillars that hold them up when they are not able to hold themselves up.  Participation Level:  Active  Participation Quality:  Appropriate  Affect:  Appropriate  Cognitive:  Oriented  Insight: Improving  Engagement in Group:  Engaged  Modes of Intervention:  Activity, Discussion, Education, and Support  Additional Comments: Pt rates his energy as a 7/10. States what holds him up, when he is not able to hold himself up is, his family, purpose and happiness.  Dione Housekeeper 03/24/2020

## 2020-03-24 NOTE — Plan of Care (Signed)
Progress note  D:pt found in the  hallway interacting with peers; compliant with medication administration. Pt presents anxious, pensive, and worried. Pt is concerned with not being able to discharge "door to door" to another facility once they are discharged. Pt expresses a plan to fill their day with AA and NA as much as possible to try and not be "idle". Pt care team is concerned though that this may not be enough. Pt is pleasant and has been viewed interacting with peers and attending groups. Pt denies si/hi/ah/vh and verbally agrees to approach staff if these become apparent or before harming themself/others while at bhh.  A: Pt provided support and encouragement. Pt given medication per protocol and standing orders. Q61m safety checks implemented and continued.  R: Pt safe on the unit. Will continue to monitor.  Pt progressing in the following metrics  Problem: Activity: Goal: Interest or engagement in activities will improve Outcome: Progressing   Problem: Health Behavior/Discharge Planning: Goal: Identification of resources available to assist in meeting health care needs will improve Outcome: Progressing Goal: Compliance with treatment plan for underlying cause of condition will improve Outcome: Progressing   Problem: Physical Regulation: Goal: Ability to maintain clinical measurements within normal limits will improve Outcome: Progressing   Problem: Education: Goal: Utilization of techniques to improve thought processes will improve Outcome: Progressing

## 2020-03-25 MED ORDER — NICOTINE POLACRILEX 2 MG MT GUM
2.0000 mg | CHEWING_GUM | OROMUCOSAL | 0 refills | Status: DC | PRN
Start: 2020-03-25 — End: 2020-04-16

## 2020-03-25 MED ORDER — QUETIAPINE FUMARATE 200 MG PO TABS: 200.0000 mg | ORAL_TABLET | Freq: Every day | ORAL | 0 refills | Status: DC

## 2020-03-25 MED ORDER — FAMOTIDINE 20 MG PO TABS
20.0000 mg | ORAL_TABLET | Freq: Two times a day (BID) | ORAL | Status: DC
  Filled 2020-03-25 (×2): qty 14

## 2020-03-25 MED ORDER — HYDROXYZINE HCL 50 MG PO TABS
50.0000 mg | ORAL_TABLET | Freq: Every day | ORAL | 0 refills | Status: DC
Start: 2020-03-25 — End: 2020-04-16

## 2020-03-25 MED ORDER — HYDROXYZINE HCL 50 MG PO TABS
50.0000 mg | ORAL_TABLET | Freq: Once | ORAL | Status: DC
  Filled 2020-03-25: qty 1

## 2020-03-25 NOTE — Progress Notes (Signed)
Adult Psychoeducational Group Note  Date:  03/25/2020 Time:  2:27 AM  Group Topic/Focus:  Wrap-Up Group:   The focus of this group is to help patients review their daily goal of treatment and discuss progress on daily workbooks.  Participation Level:  Active  Participation Quality:  Appropriate  Affect:  Appropriate  Cognitive:  Appropriate  Insight: Appropriate  Engagement in Group:  Engaged  Modes of Intervention:  Discussion  Additional Comments:  Pt attend wrap up group. His day was a 7. The one positive thing that happened to him today he talked to his mother.  Charna Busman Long 03/25/2020, 2:27 AM

## 2020-03-25 NOTE — Progress Notes (Signed)
  [facility] Adult Case Management Discharge Plan :  Will you be returning to the same living situation after discharge:  Yes,  with mom At discharge, do you have transportation home?: No. taxi voucher  Do you have the ability to pay for your medications: No. [facility] referral and samples  Release of information consent forms completed and in the chart;  Patient's signature needed at discharge.  Patient to Follow up at:  Follow-up Information    [facility]. Schedule an appointment as soon as possible for a visit.   Specialty: Behavioral Health Why: A referral has been made to this provider for therapy and medication management services.  Contact information: [address] [place] [address] [phone]       [facility] Call.   Specialty: Addiction Medicine Why: A referral has been made to this provider for residential treatment services.  Please call daily to check on bed availability. Contact information: [address] [place] [address] [phone]        [facility] Call.   Why: A referral has been made to this provider for residential treatment services.  Please call daily to check on bed availability. Contact information: [address] [place] [address] [phone]               Next level of care provider has access to [facility] Link:yes  Safety Planning and Suicide Prevention discussed: Yes,  with mother     Has patient been referred to the Quitline?: Patient refused referral  Patient has been referred for addiction treatment: Yes  Erin Sons, LCSW 03/25/2020, 11:31 AM

## 2020-03-25 NOTE — [facility] Suicide Risk Assessment (Signed)
[facility] INPATIENT:  Family/Significant Other Suicide Prevention Education  Suicide Prevention Education:  Education Completed; Juston Goheen [phone],  (name of family member/significant other) has been identified by the patient as the family member/significant other with whom the patient will be residing, and identified as the person(s) who will aid the patient in the event of a mental health crisis (suicidal ideations/suicide attempt).  With written consent from the patient, the family member/significant other has been provided the following suicide prevention education, prior to the and/or following the discharge of the patient.  The suicide prevention education provided includes the following:  Suicide risk factors  Suicide prevention and interventions  National Suicide Hotline telephone number  [facility] assessment telephone number  [facility] Emergency Assistance 911  [name] and/or Residential Mobile Crisis Unit telephone number  Request made of family/significant other to:  Remove weapons (e.g., guns, rifles, knives), all items previously/currently identified as safety concern.    Remove drugs/medications (over-the-counter, prescriptions, illicit drugs), all items previously/currently identified as a safety concern.  The family member/significant other verbalizes understanding of the suicide prevention education information provided.  The family member/significant other agrees to remove the items of safety concern listed above.   Mom confirmed pt could return home. Was concerned about plan. CSW explained that referrals had been sent to residential treatment centers and that pt would be referred to [facility] for medication management.    Erin Sons 03/25/2020, 11:25 AM

## 2020-03-25 NOTE — [facility] Suicide Risk Assessment (Signed)
[facility] Discharge Suicide Risk Assessment   Principal Problem: Cocaine abuse with cocaine-induced mood disorder [facility]) Discharge Diagnoses: Principal Problem:   Cocaine abuse with cocaine-induced mood disorder (HCC) Active Problems:   Opioid use disorder, severe, dependence (HCC)   Polysubstance abuse (HCC)   Severe recurrent major depression without psychotic features (HCC)   Benzodiazepine abuse (HCC)   Total Time spent with patient: 15 minutes  Musculoskeletal: Strength & Muscle Tone: within normal limits Gait & Station: normal Patient leans: N/A  Psychiatric Specialty Exam: Review of Systems  All other systems reviewed and are negative.   Blood pressure 118/87, pulse (!) 105, temperature 98.5 F (36.9 C), temperature source Oral, resp. rate 18, height 6' (1.829 m), weight 110 kg, SpO2 98 %.Body mass index is 32.89 kg/m.  General Appearance: Casual  Eye Contact::  Good  Speech:  Normal Rate409  Volume:  Normal  Mood:  Euthymic  Affect:  Congruent  Thought Process:  Coherent and Descriptions of Associations: Intact  Orientation:  Full (Time, Place, and Person)  Thought Content:  Logical  Suicidal Thoughts:  No  Homicidal Thoughts:  No  Memory:  Immediate;   Fair Recent;   Fair Remote;   Fair  Judgement:  Intact  Insight:  Fair  Psychomotor Activity:  Normal  Concentration:  Fair  Recall:  Good  Fund of Knowledge:Good  Language: Good  Akathisia:  Negative  Handed:  Right  AIMS (if indicated):     Assets:  Desire for Improvement Housing Resilience Social Support  Sleep:  Number of Hours: 4.5  Cognition: WNL  ADL's:  Intact   Mental Status Per Nursing Assessment::   On Admission:  Suicidal ideation indicated by patient  Demographic Factors:  Male, Caucasian, Low socioeconomic status and Unemployed  Loss Factors: Legal issues  Historical Factors: Impulsivity  Risk Reduction Factors:   Living with another person, especially a relative  Continued  Clinical Symptoms:  Alcohol/Substance Abuse/Dependencies  Cognitive Features That Contribute To Risk:  None    Suicide Risk:  Minimal: No identifiable suicidal ideation.  Patients presenting with no risk factors but with morbid ruminations; may be classified as minimal risk based on the severity of the depressive symptoms   Follow-up Information    [facility]. Schedule an appointment as soon as possible for a visit.   Specialty: Behavioral Health Why: A referral has been made to this provider for therapy and medication management services.  Contact information: [address] [place] [address] [phone]       [facility] Call.   Specialty: Addiction Medicine Why: A referral has been made to this provider for residential treatment services.  Please call daily to check on bed availability. Contact information: [address] [place] [address] [phone]        [facility] Call.   Why: A referral has been made to this provider for residential treatment services.  Please call daily to check on bed availability. Contact information: [address] [place] [address] [phone]               Plan Of Care/Follow-up recommendations:  Activity:  ad lib  Antonieta Pert, MD 03/25/2020, 11:03 AM

## 2020-03-25 NOTE — Tx Team (Signed)
Interdisciplinary Treatment and Diagnostic Plan Update  03/25/2020 Time of Session: [address] MRN: [id number]  Principal Diagnosis: Cocaine abuse with cocaine-induced mood disorder (HCC)  Secondary Diagnoses: Principal Problem:   Cocaine abuse with cocaine-induced mood disorder (HCC) Active Problems:   Opioid use disorder, severe, dependence (HCC)   Polysubstance abuse (HCC)   Severe recurrent major depression without psychotic features (HCC)   Benzodiazepine abuse (HCC)   Current Medications:  Current Facility-Administered Medications  Medication Dose Route Frequency Provider Last Rate Last Admin  . acetaminophen (TYLENOL) tablet 650 mg  650 mg Oral Q6H PRN Nira Conn A, NP   650 mg at 03/22/20 1544  . alum & mag hydroxide-simeth (MAALOX/MYLANTA) 200-200-20 MG/5ML suspension 30 mL  30 mL Oral Q4H PRN Nira Conn A, NP      . hydrOXYzine (ATARAX/VISTARIL) tablet 50 mg  50 mg Oral QHS Karsten Ro, MD   50 mg at 03/24/20 2131  . hydrOXYzine (ATARAX/VISTARIL) tablet 50 mg  50 mg Oral Once Gillermo Murdoch, NP      . magnesium hydroxide (MILK OF MAGNESIA) suspension 30 mL  30 mL Oral Daily PRN Nira Conn A, NP      . nicotine polacrilex (NICORETTE) gum 2 mg  2 mg Oral PRN Karsten Ro, MD   2 mg at 03/25/20 0752  . pantoprazole (PROTONIX) EC tablet 40 mg  40 mg Oral Daily Antonieta Pert, MD   40 mg at 03/25/20 0751  . QUEtiapine (SEROQUEL) tablet 200 mg  200 mg Oral QHS Karsten Ro, MD   200 mg at 03/24/20 2130   PTA Medications: Medications Prior to Admission  Medication Sig Dispense Refill Last Dose  . acetaminophen (TYLENOL) 500 MG tablet Take 1 tablet (500 mg total) by mouth every 6 (six) hours as needed. (Patient not taking: Reported on 03/18/2020) 30 tablet 0   . diphenhydrAMINE (BENADRYL) 25 MG tablet Take 1 tablet (25 mg total) by mouth every 6 (six) hours as needed for itching. (Patient not taking: Reported on 03/18/2020) 30 tablet 0   . famotidine (PEPCID)  20 MG tablet Take 1 tablet (20 mg total) by mouth 2 (two) times daily. (Patient not taking: Reported on 03/18/2020) 28 tablet 0   . hydrocortisone cream 1 % Apply to affected area 2 times daily on face (Patient not taking: Reported on 03/18/2020) 15 g 0   . methadone (DOLOPHINE) 1 MG/1ML solution Take 26 mg by mouth daily.     . predniSONE (DELTASONE) 10 MG tablet 3 tabs po daily x 5 days, then 2 tabs x 5 days, then 1 tab x 5 days (Patient not taking: Reported on 03/18/2020) 30 tablet 0   . triamcinolone ointment (KENALOG) 0.5 % Apply 1 application topically 2 (two) times daily. On body and extremities, NOT face (Patient not taking: Reported on 03/18/2020) 30 g 3     Patient Stressors: Legal issue Substance abuse  Patient Strengths: Ability for insight Motivation for treatment/growth Supportive family/friends  Treatment Modalities: Medication Management, Group therapy, Case management,  1 to 1 session with clinician, Psychoeducation, Recreational therapy.   Physician Treatment Plan for Primary Diagnosis: Cocaine abuse with cocaine-induced mood disorder (HCC) Long Term Goal(s): Improvement in symptoms so as ready for discharge Improvement in symptoms so as ready for discharge   Short Term Goals: Ability to identify changes in lifestyle to reduce recurrence of condition will improve Ability to verbalize feelings will improve Ability to disclose and discuss suicidal ideas Ability to demonstrate self-control will improve Ability  to identify and develop effective coping behaviors will improve Ability to maintain clinical measurements within normal limits will improve Compliance with prescribed medications will improve Ability to identify triggers associated with substance abuse/mental health issues will improve Ability to identify changes in lifestyle to reduce recurrence of condition will improve Ability to verbalize feelings will improve Ability to disclose and discuss suicidal  ideas Ability to demonstrate self-control will improve Ability to identify and develop effective coping behaviors will improve Ability to maintain clinical measurements within normal limits will improve Compliance with prescribed medications will improve Ability to identify triggers associated with substance abuse/mental health issues will improve  Medication Management: Evaluate patient's response, side effects, and tolerance of medication regimen.  Therapeutic Interventions: 1 to 1 sessions, Unit Group sessions and Medication administration.  Evaluation of Outcomes: Progressing  Physician Treatment Plan for Secondary Diagnosis: Principal Problem:   Cocaine abuse with cocaine-induced mood disorder (HCC) Active Problems:   Opioid use disorder, severe, dependence (HCC)   Polysubstance abuse (HCC)   Severe recurrent major depression without psychotic features (HCC)   Benzodiazepine abuse (HCC)  Long Term Goal(s): Improvement in symptoms so as ready for discharge Improvement in symptoms so as ready for discharge   Short Term Goals: Ability to identify changes in lifestyle to reduce recurrence of condition will improve Ability to verbalize feelings will improve Ability to disclose and discuss suicidal ideas Ability to demonstrate self-control will improve Ability to identify and develop effective coping behaviors will improve Ability to maintain clinical measurements within normal limits will improve Compliance with prescribed medications will improve Ability to identify triggers associated with substance abuse/mental health issues will improve Ability to identify changes in lifestyle to reduce recurrence of condition will improve Ability to verbalize feelings will improve Ability to disclose and discuss suicidal ideas Ability to demonstrate self-control will improve Ability to identify and develop effective coping behaviors will improve Ability to maintain clinical measurements  within normal limits will improve Compliance with prescribed medications will improve Ability to identify triggers associated with substance abuse/mental health issues will improve     Medication Management: Evaluate patient's response, side effects, and tolerance of medication regimen.  Therapeutic Interventions: 1 to 1 sessions, Unit Group sessions and Medication administration.  Evaluation of Outcomes: Progressing   RN Treatment Plan for Primary Diagnosis: Cocaine abuse with cocaine-induced mood disorder (HCC) Long Term Goal(s): Knowledge of disease and therapeutic regimen to maintain health will improve  Short Term Goals: Ability to remain free from injury will improve, Ability to disclose and discuss suicidal ideas, Ability to identify and develop effective coping behaviors will improve and Compliance with prescribed medications will improve  Medication Management: RN will administer medications as ordered by provider, will assess and evaluate patient's response and provide education to patient for prescribed medication. RN will report any adverse and/or side effects to prescribing provider.  Therapeutic Interventions: 1 on 1 counseling sessions, Psychoeducation, Medication administration, Evaluate responses to treatment, Monitor vital signs and CBGs as ordered, Perform/monitor CIWA, COWS, AIMS and Fall Risk screenings as ordered, Perform wound care treatments as ordered.  Evaluation of Outcomes: Progressing   LCSW Treatment Plan for Primary Diagnosis: Cocaine abuse with cocaine-induced mood disorder (HCC) Long Term Goal(s): Safe transition to appropriate next level of care at discharge, Engage patient in therapeutic group addressing interpersonal concerns.  Short Term Goals: Engage patient in aftercare planning with referrals and resources, Increase ability to appropriately verbalize feelings, Increase emotional regulation and Increase skills for wellness and recovery  Therapeutic  Interventions:  Assess for all discharge needs, 1 to 1 time with Social worker, Explore available resources and support systems, Assess for adequacy in community support network, Educate family and significant other(s) on suicide prevention, Complete Psychosocial Assessment, Interpersonal group therapy.  Evaluation of Outcomes: Progressing   Progress in Treatment: Attending groups: Yes. Participating in groups: Yes. Taking medication as prescribed: Yes. Toleration medication: Yes. Family/Significant other contact made: Yes, individual(s) contacted:  mother Patient understands diagnosis: Yes. Discussing patient identified problems/goals with staff: Yes. Medical problems stabilized or resolved: Yes. Denies suicidal/homicidal ideation: Yes. Issues/concerns per patient self-inventory: No. Other: N/A  New problem(s) identified: No, Describe:  None noted.  New Short Term/Long Term Goal(s): Safe transition to appropriate next level of care at discharge, Engage patient in therapeutic group addressing interpersonal concerns.  Patient Goals:  "Total sobriety  Discharge Plan or Barriers: Pt to return to family. Pt to follow up with recommended level of care and medication management services.  Reason for Continuation of Hospitalization: Anxiety Depression Medication stabilization  Estimated Length of Stay: 3-5 days  Attendees: Patient:  03/25/2020  Physician:  03/25/2020   Nursing:  03/25/2020   RN Care Manager: 03/25/2020  Social Worker: Ruthann Cancer, LCSW 03/25/2020   Recreational Therapist:  03/25/2020   Other:  03/25/2020   Other:  03/25/2020   Other: 03/25/2020     Scribe for Treatment Team: Otelia Santee, LCSW 03/25/2020 10:37 AM
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
## 2020-03-25 NOTE — Progress Notes (Signed)
D:  Patient's self inventory sheet, patient sleeps good, sleep medication helpful.  Good appetite, normal energy level, good concentration.  Rated depression and hopeless 3, anxiety 8.  Denied withdrawals.  Denied SI.  Denied physical problems.  Denied physical pain.  Goal is discharge.  Does have discharge plans. A:  Medications administered per MD orders.  Emotional support and encouragement given patient. R:  Denied SI and HI, contracts for safety.  Denied A/V hallucinations.  Safety maintained with 15 minute checks.

## 2020-03-25 NOTE — [facility] Counselor (Signed)
CSW refaxed referral [facility].   CSW faxed referral to [facility]

## 2020-03-25 NOTE — Progress Notes (Signed)
Discharge Note:  Patient discharged home.  Patient denied SI and HI.  Denied A/V hallucinations.  Suicide prevention information given and discussed with patient who stated he understood and had no questions.  Patient stated he received all his belongings, clothing, toiletries, misc items, etc.  Patient stated he appreciated all assistance received from [facility] staff.  All required discharge information given to patient at discharge.  

## 2020-03-25 NOTE — Progress Notes (Signed)
Recreation Therapy Notes  Date: 9.6.21 Time: 0930 Location: [address] Group Room  Group Topic: Stress Management  Goal Area(s) Addresses:  Patient will identify positive stress management techniques. Patient will identify benefits of using stress management post d/c.  Intervention: Stress Management  Activity:  Meditation. LRT played a meditation that focused on making choices.  Patients were to listen and follow along as meditation played to fully engage in activity.    Education:  Stress Management, Discharge Planning.   Education Outcome: Acknowledges Education  Clinical Observations/Feedback: Pt did not attend group activity.   Bora Broner, LRT/CTRS         Cyntha Brickman A 03/25/2020 11:00 AM 

## 2020-04-16 ENCOUNTER — Emergency Department (HOSPITAL_COMMUNITY): Payer: Self-pay

## 2020-04-16 ENCOUNTER — Other Ambulatory Visit: Payer: Self-pay

## 2020-04-16 ENCOUNTER — Encounter (HOSPITAL_COMMUNITY): Payer: Self-pay | Admitting: Emergency Medicine

## 2020-04-16 ENCOUNTER — Inpatient Hospital Stay (HOSPITAL_COMMUNITY)
Admission: EM | Admit: 2020-04-16 | Discharge: 2020-04-19 | DRG: 872 | Disposition: A | Discharge status: Home / Self Care | Payer: Self-pay | Attending: Internal Medicine | Admitting: Internal Medicine

## 2020-04-16 DIAGNOSIS — E876 Hypokalemia: Secondary | ICD-10-CM

## 2020-04-16 DIAGNOSIS — F1310 Sedative, hypnotic or anxiolytic abuse, uncomplicated: Secondary | ICD-10-CM | POA: Diagnosis present

## 2020-04-16 DIAGNOSIS — Z87891 Personal history of nicotine dependence: Secondary | ICD-10-CM

## 2020-04-16 DIAGNOSIS — F332 Major depressive disorder, recurrent severe without psychotic features: Secondary | ICD-10-CM | POA: Diagnosis present

## 2020-04-16 DIAGNOSIS — B1920 Unspecified viral hepatitis C without hepatic coma: Secondary | ICD-10-CM | POA: Diagnosis present

## 2020-04-16 DIAGNOSIS — K8000 Calculus of gallbladder with acute cholecystitis without obstruction: Secondary | ICD-10-CM | POA: Diagnosis present

## 2020-04-16 DIAGNOSIS — Z23 Encounter for immunization: Secondary | ICD-10-CM

## 2020-04-16 DIAGNOSIS — Z79899 Other long term (current) drug therapy: Secondary | ICD-10-CM

## 2020-04-16 DIAGNOSIS — M5126 Other intervertebral disc displacement, lumbar region: Secondary | ICD-10-CM | POA: Diagnosis present

## 2020-04-16 DIAGNOSIS — R739 Hyperglycemia, unspecified: Secondary | ICD-10-CM

## 2020-04-16 DIAGNOSIS — Z833 Family history of diabetes mellitus: Secondary | ICD-10-CM

## 2020-04-16 DIAGNOSIS — Z720 Tobacco use: Secondary | ICD-10-CM

## 2020-04-16 DIAGNOSIS — F1410 Cocaine abuse, uncomplicated: Secondary | ICD-10-CM | POA: Diagnosis present

## 2020-04-16 DIAGNOSIS — K219 Gastro-esophageal reflux disease without esophagitis: Secondary | ICD-10-CM | POA: Diagnosis present

## 2020-04-16 DIAGNOSIS — D509 Iron deficiency anemia, unspecified: Secondary | ICD-10-CM

## 2020-04-16 DIAGNOSIS — R1030 Lower abdominal pain, unspecified: Secondary | ICD-10-CM

## 2020-04-16 DIAGNOSIS — A419 Sepsis, unspecified organism: Principal | ICD-10-CM | POA: Diagnosis present

## 2020-04-16 DIAGNOSIS — R6520 Severe sepsis without septic shock: Secondary | ICD-10-CM | POA: Diagnosis present

## 2020-04-16 DIAGNOSIS — F1110 Opioid abuse, uncomplicated: Secondary | ICD-10-CM | POA: Diagnosis present

## 2020-04-16 DIAGNOSIS — R1011 Right upper quadrant pain: Secondary | ICD-10-CM

## 2020-04-16 DIAGNOSIS — F1990 Other psychoactive substance use, unspecified, uncomplicated: Secondary | ICD-10-CM

## 2020-04-16 DIAGNOSIS — Z20822 Contact with and (suspected) exposure to covid-19: Secondary | ICD-10-CM | POA: Diagnosis present

## 2020-04-16 DIAGNOSIS — E871 Hypo-osmolality and hyponatremia: Secondary | ICD-10-CM | POA: Diagnosis present

## 2020-04-16 DIAGNOSIS — R509 Fever, unspecified: Secondary | ICD-10-CM

## 2020-04-16 DIAGNOSIS — M545 Low back pain, unspecified: Secondary | ICD-10-CM

## 2020-04-16 DIAGNOSIS — E872 Acidosis: Secondary | ICD-10-CM | POA: Diagnosis present

## 2020-04-16 DIAGNOSIS — D696 Thrombocytopenia, unspecified: Secondary | ICD-10-CM | POA: Diagnosis present

## 2020-04-16 HISTORY — DX: Depression, unspecified: F32.A

## 2020-04-16 LAB — URINALYSIS, ROUTINE W REFLEX MICROSCOPIC
Bacteria, UA: NONE SEEN
Bilirubin Urine: NEGATIVE
Glucose, UA: >=500 mg/dL — AB
Hgb urine dipstick: NEGATIVE
Ketones, ur: NEGATIVE mg/dL
Leukocytes,Ua: NEGATIVE
Nitrite: NEGATIVE
Protein, ur: NEGATIVE mg/dL
Specific Gravity, Urine: 1.026 (ref 1.005–1.030)
pH: 5 (ref 5.0–8.0)

## 2020-04-16 LAB — RESPIRATORY PANEL BY RT PCR (FLU A&B, COVID)
Influenza A by PCR: NEGATIVE
Influenza B by PCR: NEGATIVE
SARS Coronavirus 2 by RT PCR: NEGATIVE

## 2020-04-16 LAB — CBC WITH DIFFERENTIAL/PLATELET
Abs Immature Granulocytes: 0.1 10*3/uL — ABNORMAL HIGH (ref 0.00–0.07)
Basophils Absolute: 0 10*3/uL (ref 0.0–0.1)
Basophils Relative: 0 %
Eosinophils Absolute: 0 10*3/uL (ref 0.0–0.5)
Eosinophils Relative: 0 %
HCT: 35.9 % — ABNORMAL LOW (ref 39.0–52.0)
Hemoglobin: 12.4 g/dL — ABNORMAL LOW (ref 13.0–17.0)
Immature Granulocytes: 1 %
Lymphocytes Relative: 9 %
Lymphs Abs: 1.1 10*3/uL (ref 0.7–4.0)
MCH: 27.3 pg (ref 26.0–34.0)
MCHC: 34.5 g/dL (ref 30.0–36.0)
MCV: 79.1 fL — ABNORMAL LOW (ref 80.0–100.0)
Monocytes Absolute: 0.8 10*3/uL (ref 0.1–1.0)
Monocytes Relative: 7 %
Neutro Abs: 9.3 10*3/uL — ABNORMAL HIGH (ref 1.7–7.7)
Neutrophils Relative %: 83 %
Platelets: 133 10*3/uL — ABNORMAL LOW (ref 150–400)
RBC: 4.54 MIL/uL (ref 4.22–5.81)
RDW: 13.8 % (ref 11.5–15.5)
WBC: 11.4 10*3/uL — ABNORMAL HIGH (ref 4.0–10.5)
nRBC: 0 % (ref 0.0–0.2)

## 2020-04-16 LAB — COMPREHENSIVE METABOLIC PANEL
ALT: 82 U/L — ABNORMAL HIGH (ref 0–44)
AST: 94 U/L — ABNORMAL HIGH (ref 15–41)
Albumin: 3.1 g/dL — ABNORMAL LOW (ref 3.5–5.0)
Alkaline Phosphatase: 98 U/L (ref 38–126)
Anion gap: 9 (ref 5–15)
BUN: 9 mg/dL (ref 6–20)
CO2: 20 mmol/L — ABNORMAL LOW (ref 22–32)
Calcium: 8.2 mg/dL — ABNORMAL LOW (ref 8.9–10.3)
Chloride: 105 mmol/L (ref 98–111)
Creatinine, Ser: 0.89 mg/dL (ref 0.61–1.24)
GFR calc Af Amer: >60 mL/min (ref >60)
GFR calc non Af Amer: >60 mL/min (ref >60)
Glucose, Bld: 313 mg/dL — ABNORMAL HIGH (ref 70–99)
Potassium: 3.3 mmol/L — ABNORMAL LOW (ref 3.5–5.1)
Sodium: 134 mmol/L — ABNORMAL LOW (ref 135–145)
Total Bilirubin: 1 mg/dL (ref 0.3–1.2)
Total Protein: 6.9 g/dL (ref 6.5–8.1)

## 2020-04-16 LAB — PROTIME-INR
INR: 1.3 — ABNORMAL HIGH (ref 0.8–1.2)
Prothrombin Time: 15.8 seconds — ABNORMAL HIGH (ref 11.4–15.2)

## 2020-04-16 LAB — CBG MONITORING, ED
Glucose-Capillary: 239 mg/dL — ABNORMAL HIGH (ref 70–99)
Glucose-Capillary: 115 mg/dL — ABNORMAL HIGH (ref 70–99)
Glucose-Capillary: 108 mg/dL — ABNORMAL HIGH (ref 70–99)

## 2020-04-16 LAB — LACTIC ACID, PLASMA
Lactic Acid, Venous: 3.3 mmol/L (ref 0.5–1.9)
Lactic Acid, Venous: 1.8 mmol/L (ref 0.5–1.9)

## 2020-04-16 MED ORDER — NICOTINE 21 MG/24HR TD PT24
21.0000 mg | MEDICATED_PATCH | Freq: Every day | TRANSDERMAL | Status: DC
  Administered 2020-04-16 – 2020-04-19 (×4): 21 mg via TRANSDERMAL
  Filled 2020-04-16 (×4): qty 1

## 2020-04-16 MED ORDER — ACETAMINOPHEN 325 MG PO TABS
650.0000 mg | ORAL_TABLET | ORAL | Status: DC | PRN
  Administered 2020-04-17 – 2020-04-19 (×4): 650 mg via ORAL
  Filled 2020-04-16 (×4): qty 2

## 2020-04-16 MED ORDER — SODIUM CHLORIDE 0.9 % IV BOLUS
1000.0000 mL | Freq: Once | INTRAVENOUS | Status: AC
  Administered 2020-04-16: 1000 mL via INTRAVENOUS

## 2020-04-16 MED ORDER — ONDANSETRON HCL 4 MG/2ML IJ SOLN: 4.0000 mg | Freq: Four times a day (QID) | INTRAMUSCULAR | Status: DC | PRN

## 2020-04-16 MED ORDER — QUETIAPINE FUMARATE 200 MG PO TABS
200.0000 mg | ORAL_TABLET | Freq: Every day | ORAL | Status: DC
  Administered 2020-04-16 – 2020-04-18 (×2): 200 mg via ORAL
  Filled 2020-04-16: qty 2
  Filled 2020-04-16 (×2): qty 1

## 2020-04-16 MED ORDER — MORPHINE SULFATE (PF) 2 MG/ML IV SOLN
2.0000 mg | INTRAVENOUS | Status: DC | PRN
  Administered 2020-04-16 – 2020-04-18 (×13): 2 mg via INTRAVENOUS
  Filled 2020-04-16 (×14): qty 1

## 2020-04-16 MED ORDER — KETOROLAC TROMETHAMINE 30 MG/ML IJ SOLN
30.0000 mg | Freq: Once | INTRAMUSCULAR | Status: AC
  Administered 2020-04-16: 30 mg via INTRAVENOUS
  Filled 2020-04-16: qty 1

## 2020-04-16 MED ORDER — LACTATED RINGERS IV SOLN: INTRAVENOUS | Status: DC

## 2020-04-16 MED ORDER — INFLUENZA VAC SPLIT QUAD 0.5 ML IM SUSY
0.5000 mL | PREFILLED_SYRINGE | INTRAMUSCULAR | Status: AC
  Administered 2020-04-17: 0.5 mL via INTRAMUSCULAR
  Filled 2020-04-16: qty 0.5

## 2020-04-16 MED ORDER — INSULIN ASPART 100 UNIT/ML ~~LOC~~ SOLN
0.0000 [IU] | Freq: Three times a day (TID) | SUBCUTANEOUS | Status: DC
  Administered 2020-04-17: 3 [IU] via SUBCUTANEOUS
  Administered 2020-04-17: 8 [IU] via SUBCUTANEOUS
  Administered 2020-04-17: 5 [IU] via SUBCUTANEOUS
  Filled 2020-04-16: qty 0.15

## 2020-04-16 MED ORDER — PIPERACILLIN-TAZOBACTAM 3.375 G IVPB
3.3750 g | Freq: Three times a day (TID) | INTRAVENOUS | Status: DC
  Administered 2020-04-16 – 2020-04-19 (×8): 3.375 g via INTRAVENOUS
  Filled 2020-04-16 (×9): qty 50

## 2020-04-16 MED ORDER — ONDANSETRON HCL 4 MG PO TABS: 4.0000 mg | ORAL_TABLET | Freq: Four times a day (QID) | ORAL | Status: DC | PRN

## 2020-04-16 MED ORDER — SODIUM CHLORIDE 0.9% FLUSH: 3.0000 mL | Freq: Two times a day (BID) | INTRAVENOUS | Status: DC

## 2020-04-16 MED ORDER — ENOXAPARIN SODIUM 40 MG/0.4ML ~~LOC~~ SOLN
40.0000 mg | Freq: Every day | SUBCUTANEOUS | Status: DC
  Administered 2020-04-16 – 2020-04-18 (×3): 40 mg via SUBCUTANEOUS
  Filled 2020-04-16 (×3): qty 0.4

## 2020-04-16 MED ORDER — ACETAMINOPHEN 325 MG PO TABS
650.0000 mg | ORAL_TABLET | Freq: Once | ORAL | Status: AC
  Administered 2020-04-16: 650 mg via ORAL
  Filled 2020-04-16: qty 2

## 2020-04-16 MED ORDER — GADOBUTROL 1 MMOL/ML IV SOLN
10.0000 mL | Freq: Once | INTRAVENOUS | Status: AC | PRN
  Administered 2020-04-16: 10 mL via INTRAVENOUS

## 2020-04-16 MED ORDER — MORPHINE SULFATE (PF) 4 MG/ML IV SOLN
4.0000 mg | Freq: Once | INTRAVENOUS | Status: AC
  Administered 2020-04-16: 4 mg via INTRAVENOUS
  Filled 2020-04-16: qty 1

## 2020-04-16 MED ORDER — IOHEXOL 300 MG/ML  SOLN
100.0000 mL | Freq: Once | INTRAMUSCULAR | Status: AC | PRN
  Administered 2020-04-16: 100 mL via INTRAVENOUS

## 2020-04-16 NOTE — ED Provider Notes (Addendum)
Lower abd pain to back.  Initially had a fever 103, tachycardic, along with chronic transaminitis.  No heart murmur.   Need admission for cholecystitis. GI wants HIDA scan and call surgery.    5:29 PM Appreciate consultation from [facility] hospitalist Dr. Frederick Peers who agrees to see and admit patient for further care.  CRITICAL CARE Performed by: Fayrene Helper Total critical care time: 32 minutes Critical care time was exclusive of separately billable procedures and treating other patients. Critical care was necessary to treat or prevent imminent or life-threatening deterioration. Critical care was time spent personally by me on the following activities: development of treatment plan with patient and/or surrogate as well as nursing, discussions with consultants, evaluation of patient's response to treatment, examination of patient, obtaining history from patient or surrogate, ordering and performing treatments and interventions, ordering and review of laboratory studies, ordering and review of radiographic studies, pulse oximetry and re-evaluation of patient's condition.   BP 123/76 (BP Location: Left Arm)    Pulse 95    Temp 99.8 F (37.7 C) (Oral)    Resp 18    Ht 5\' 11"  (1.803 m)    Wt 97.5 kg    SpO2 98%    BMI 29.99 kg/m   Results for orders placed or performed during the hospital encounter of 04/16/20  Respiratory Panel by RT PCR (Flu A&B, Covid) - Nasopharyngeal Swab   Specimen: Nasopharyngeal Swab  Result Value Ref Range   SARS Coronavirus 2 by RT PCR NEGATIVE NEGATIVE   Influenza A by PCR NEGATIVE NEGATIVE   Influenza B by PCR NEGATIVE NEGATIVE  Comprehensive metabolic panel  Result Value Ref Range   Sodium 134 (L) 135 - 145 mmol/L   Potassium 3.3 (L) 3.5 - 5.1 mmol/L   Chloride 105 98 - 111 mmol/L   CO2 20 (L) 22 - 32 mmol/L   Glucose, Bld 313 (H) 70 - 99 mg/dL   BUN 9 6 - 20 mg/dL   Creatinine, Ser 04/18/20 0.61 - 1.24 mg/dL   Calcium 8.2 (L) 8.9 - 10.3 mg/dL   Total Protein 6.9 6.5  - 8.1 g/dL   Albumin 3.1 (L) 3.5 - 5.0 g/dL   AST 94 (H) 15 - 41 U/L   ALT 82 (H) 0 - 44 U/L   Alkaline Phosphatase 98 38 - 126 U/L   Total Bilirubin 1.0 0.3 - 1.2 mg/dL   GFR calc non Af Amer >60 >60 mL/min   GFR calc Af Amer >60 >60 mL/min   Anion gap 9 5 - 15  Lactic acid, plasma  Result Value Ref Range   Lactic Acid, Venous 3.3 (HH) 0.5 - 1.9 mmol/L  Lactic acid, plasma  Result Value Ref Range   Lactic Acid, Venous 1.8 0.5 - 1.9 mmol/L  CBC with Differential  Result Value Ref Range   WBC 11.4 (H) 4.0 - 10.5 K/uL   RBC 4.54 4.22 - 5.81 MIL/uL   Hemoglobin 12.4 (L) 13.0 - 17.0 g/dL   HCT 0.93 (L) 39 - 52 %   MCV 79.1 (L) 80.0 - 100.0 fL   MCH 27.3 26.0 - 34.0 pg   MCHC 34.5 30.0 - 36.0 g/dL   RDW 26.7 12.4 - 58.0 %   Platelets 133 (L) 150 - 400 K/uL   nRBC 0.0 0.0 - 0.2 %   Neutrophils Relative % 83 %   Neutro Abs 9.3 (H) 1.7 - 7.7 K/uL   Lymphocytes Relative 9 %   Lymphs Abs 1.1 0.7 - 4.0  K/uL   Monocytes Relative 7 %   Monocytes Absolute 0.8 0 - 1 K/uL   Eosinophils Relative 0 %   Eosinophils Absolute 0.0 0 - 0 K/uL   Basophils Relative 0 %   Basophils Absolute 0.0 0 - 0 K/uL   Immature Granulocytes 1 %   Abs Immature Granulocytes 0.10 (H) 0.00 - 0.07 K/uL  Protime-INR  Result Value Ref Range   Prothrombin Time 15.8 (H) 11.4 - 15.2 seconds   INR 1.3 (H) 0.8 - 1.2  Urinalysis, Routine w reflex microscopic Urine, Clean Catch  Result Value Ref Range   Color, Urine YELLOW YELLOW   APPearance CLEAR CLEAR   Specific Gravity, Urine 1.026 1.005 - 1.030   pH 5.0 5.0 - 8.0   Glucose, UA >=500 (A) NEGATIVE mg/dL   Hgb urine dipstick NEGATIVE NEGATIVE   Bilirubin Urine NEGATIVE NEGATIVE   Ketones, ur NEGATIVE NEGATIVE mg/dL   Protein, ur NEGATIVE NEGATIVE mg/dL   Nitrite NEGATIVE NEGATIVE   Leukocytes,Ua NEGATIVE NEGATIVE   WBC, UA 0-5 0 - 5 WBC/hpf   Bacteria, UA NONE SEEN NONE SEEN   Mucus PRESENT   CBG monitoring, ED  Result Value Ref Range   Glucose-Capillary  239 (H) 70 - 99 mg/dL   DG Chest 2 View  Result Date: 04/16/2020 CLINICAL DATA:  Shortness of breath, fever, and weakness for a few days EXAM: CHEST - 2 VIEW COMPARISON:  12/18/2018 FINDINGS: The heart size and mediastinal contours are within normal limits. Both lungs are clear. The visualized skeletal structures are unremarkable. IMPRESSION: No active cardiopulmonary disease. Electronically Signed   By: Burman Nieves M.D.   On: 04/16/2020 03:54   MR Lumbar Spine W Wo Contrast  Result Date: 04/16/2020 CLINICAL DATA:  Lower abdominal pain, nausea, fever EXAM: MRI LUMBAR SPINE WITHOUT AND WITH CONTRAST TECHNIQUE: Multiplanar and multiecho pulse sequences of the lumbar spine were obtained without and with intravenous contrast. CONTRAST:  2mL GADAVIST GADOBUTROL 1 MMOL/ML IV SOLN COMPARISON:  None. FINDINGS: Segmentation:  Standard. Alignment:  Physiologic. Vertebrae:  No fracture, evidence of discitis, or bone lesion. Conus medullaris and cauda equina: Conus extends to the L1 level. Conus and cauda equina appear normal. Paraspinal and other soft tissues: No acute paraspinal abnormality. Disc levels: Disc spaces: Disc spaces are maintained. T12-L1: No significant disc bulge. No evidence of neural foraminal stenosis. No central canal stenosis. L1-L2: No significant disc bulge. No evidence of neural foraminal stenosis. No central canal stenosis. L2-L3: No significant disc bulge. No evidence of neural foraminal stenosis. No central canal stenosis. L3-L4: Mild broad-based disc bulge with a small broad central disc protrusion. No evidence of neural foraminal stenosis. No central canal stenosis. L4-L5: No significant disc bulge. No evidence of neural foraminal stenosis. No central canal stenosis. L5-S1: No significant disc bulge. No evidence of neural foraminal stenosis. No central canal stenosis. IMPRESSION: 1. No acute osseous injury of the lumbar spine. 2. No evidence of discitis or osteomyelitis of the lumbar  spine. 3. At L3-4 there is a mild broad-based disc bulge with a small broad central disc protrusion. Electronically Signed   By: Elige Ko   On: 04/16/2020 09:52   CT ABDOMEN PELVIS W CONTRAST  Result Date: 04/16/2020 CLINICAL DATA:  Lower abdominal pain, fever, and nausea. Abscess/infection suspected. EXAM: CT ABDOMEN AND PELVIS WITH CONTRAST TECHNIQUE: Multidetector CT imaging of the abdomen and pelvis was performed using the standard protocol following bolus administration of intravenous contrast. CONTRAST:  OMNIPAQUE IOHEXOL 300 MG/ML  SOLN COMPARISON:  None. FINDINGS: Lower chest: No basilar lung consolidation or pleural effusion. A few small subpleural nodules in the right lower lobe and right middle lobe measuring 2-3 mm in size are likely benign in a patient of this age without a history of malignancy. Hepatobiliary: Diffusely decreased attenuation of the liver compatible with steatosis. Small stones in the gallbladder with mild gallbladder wall thickening and/or small volume pericholecystic fluid. No biliary dilatation. Pancreas: Unremarkable. Spleen: Splenomegaly with an AP diameter of 15.1 cm. Adrenals/Urinary Tract: Unremarkable adrenal glands. No evidence of renal mass, calculi, or hydronephrosis. Excreted contrast material in the bladder likely related to the MRI earlier today. Stomach/Bowel: The stomach is unremarkable. There is no evidence of bowel obstruction or inflammation. The appendix is unremarkable. Vascular/Lymphatic: No significant vascular findings are present. No enlarged abdominal or pelvic lymph nodes. Reproductive: Unremarkable prostate. Other: Trace free fluid in the pelvis no pneumoperitoneum. Musculoskeletal: No acute osseous abnormality or suspicious osseous lesion. IMPRESSION: 1. Cholelithiasis with mild gallbladder wall thickening and/or small volume pericholecystic fluid. Consider right upper quadrant ultrasound to further evaluate for possible cholecystitis. 2.  Trace pelvic free fluid. 3. Hepatic steatosis. 4. Splenomegaly. Electronically Signed   By: Sebastian Ache M.D.   On: 04/16/2020 12:45   US Abdomen Limited RUQ  Result Date: 04/16/2020 CLINICAL DATA:  Right upper quadrant abdominal pain. EXAM: ULTRASOUND ABDOMEN LIMITED RIGHT UPPER QUADRANT COMPARISON:  None. FINDINGS: Gallbladder: There is mild gallbladder wall thickening. Multiple gallstones, measuring up to 0.5 cm. No sonographic Murphy sign noted by the sonographer. The gallbladder is not distended. Common bile duct: Diameter: 4.6 mm, within normal limits. Liver: No focal lesion identified. Within normal limits in parenchymal echogenicity. Portal vein is patent on color Doppler imaging with normal direction of blood flow towards the liver. IMPRESSION: Mild gallbladder wall thickening with gallstones. No gallbladder distension and negative sonographic Murphy sign per the technologist. Findings are equivocal for acute cholecystitis. If there is clinical uncertainty then a HIDA scan could further evaluate. Electronically Signed   By: Feliberto Harts MD   On: 04/16/2020 14:33      Fayrene Helper, PA-C 04/16/20 1729    Tegeler, Canary Brim, MD 04/16/20 1913    Fayrene Helper, PA-C 04/16/20 2347    Tegeler, Canary Brim, MD 04/16/20 [phone]

## 2020-04-16 NOTE — Assessment & Plan Note (Signed)
-  Replete and recheck as needed 

## 2020-04-16 NOTE — ED Provider Notes (Signed)
Care assumed from [place]. See her note for full H&P.   Per her note, "Frederick Franklin is a 29 y.o. male.  Patient to ED for evaluation of lower abdominal pain, fever, nausea without vomiting. No diarrhea. No SOB, cough or congestion. He has not received COVID vaccinations. 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 No dysuria but he reports his urine appears dark.   The history is provided by the patient. No language interpreter was used. "  Physical Exam  BP 123/76 (BP Location: Left Arm)   Pulse 95   Temp 99.8 F (37.7 C) (Oral)   Resp 18   Ht 5\' 11"  (1.803 m)   Wt 97.5 kg   SpO2 98%   BMI 29.99 kg/m   Physical Exam Vitals and nursing note reviewed.  Constitutional:      Appearance: He is well-developed.  HENT:     Head: Normocephalic and atraumatic.  Eyes:     Conjunctiva/sclera: Conjunctivae normal.  Cardiovascular:     Rate and Rhythm: Normal rate and regular rhythm.     Heart sounds: No murmur heard.   Pulmonary:     Effort: Pulmonary effort is normal. No respiratory distress.     Breath sounds: Normal breath sounds.  Abdominal:     Palpations: Abdomen is soft.     Tenderness: There is abdominal tenderness in the right upper quadrant. There is guarding.  Musculoskeletal:     Cervical back: Neck supple.  Skin:    General: Skin is warm and dry.  Neurological:     Mental Status: He is alert.      ED Course/Procedures     Procedures  Results for orders placed or performed during the hospital encounter of 04/16/20  Respiratory Panel by RT PCR (Flu A&B, Covid) - Nasopharyngeal Swab   Specimen: Nasopharyngeal Swab  Result Value Ref Range   SARS Coronavirus 2 by RT PCR NEGATIVE NEGATIVE   Influenza A by PCR NEGATIVE NEGATIVE   Influenza B by PCR NEGATIVE NEGATIVE  Comprehensive metabolic panel  Result Value Ref Range   Sodium 134 (L) 135 - 145 mmol/L   Potassium 3.3 (L) 3.5 - 5.1 mmol/L   Chloride 105 98 - 111  mmol/L   CO2 20 (L) 22 - 32 mmol/L   Glucose, Bld 313 (H) 70 - 99 mg/dL   BUN 9 6 - 20 mg/dL   Creatinine, Ser 1.610.89 0.61 - 1.24 mg/dL   Calcium 8.2 (L) 8.9 - 10.3 mg/dL   Total Protein 6.9 6.5 - 8.1 g/dL   Albumin 3.1 (L) 3.5 - 5.0 g/dL   AST 94 (H) 15 - 41 U/L   ALT 82 (H) 0 - 44 U/L   Alkaline Phosphatase 98 38 - 126 U/L   Total Bilirubin 1.0 0.3 - 1.2 mg/dL   GFR calc non Af Amer >60 >60 mL/min   GFR calc Af Amer >60 >60 mL/min   Anion gap 9 5 - 15  Lactic acid, plasma  Result Value Ref Range   Lactic Acid, Venous 3.3 (HH) 0.5 - 1.9 mmol/L  Lactic acid, plasma  Result Value Ref Range   Lactic Acid, Venous 1.8 0.5 - 1.9 mmol/L  CBC with Differential  Result Value Ref Range   WBC 11.4 (H) 4.0 - 10.5 K/uL   RBC 4.54 4.22 - 5.81 MIL/uL   Hemoglobin 12.4 (L) 13.0 - 17.0 g/dL   HCT 09.635.9 (L) 39 - 52 %  MCV 79.1 (L) 80.0 - 100.0 fL   MCH 27.3 26.0 - 34.0 pg   MCHC 34.5 30.0 - 36.0 g/dL   RDW 02.5 85.2 - 77.8 %   Platelets 133 (L) 150 - 400 K/uL   nRBC 0.0 0.0 - 0.2 %   Neutrophils Relative % 83 %   Neutro Abs 9.3 (H) 1.7 - 7.7 K/uL   Lymphocytes Relative 9 %   Lymphs Abs 1.1 0.7 - 4.0 K/uL   Monocytes Relative 7 %   Monocytes Absolute 0.8 0 - 1 K/uL   Eosinophils Relative 0 %   Eosinophils Absolute 0.0 0 - 0 K/uL   Basophils Relative 0 %   Basophils Absolute 0.0 0 - 0 K/uL   Immature Granulocytes 1 %   Abs Immature Granulocytes 0.10 (H) 0.00 - 0.07 K/uL  Protime-INR  Result Value Ref Range   Prothrombin Time 15.8 (H) 11.4 - 15.2 seconds   INR 1.3 (H) 0.8 - 1.2  Urinalysis, Routine w reflex microscopic Urine, Clean Catch  Result Value Ref Range   Color, Urine YELLOW YELLOW   APPearance CLEAR CLEAR   Specific Gravity, Urine 1.026 1.005 - 1.030   pH 5.0 5.0 - 8.0   Glucose, UA >=500 (A) NEGATIVE mg/dL   Hgb urine dipstick NEGATIVE NEGATIVE   Bilirubin Urine NEGATIVE NEGATIVE   Ketones, ur NEGATIVE NEGATIVE mg/dL   Protein, ur NEGATIVE NEGATIVE mg/dL   Nitrite  NEGATIVE NEGATIVE   Leukocytes,Ua NEGATIVE NEGATIVE   WBC, UA 0-5 0 - 5 WBC/hpf   Bacteria, UA NONE SEEN NONE SEEN   Mucus PRESENT   CBG monitoring, ED  Result Value Ref Range   Glucose-Capillary 239 (H) 70 - 99 mg/dL   DG Chest 2 View  Result Date: 04/16/2020 CLINICAL DATA:  Shortness of breath, fever, and weakness for a few days EXAM: CHEST - 2 VIEW COMPARISON:  12/18/2018 FINDINGS: The heart size and mediastinal contours are within normal limits. Both lungs are clear. The visualized skeletal structures are unremarkable. IMPRESSION: No active cardiopulmonary disease. Electronically Signed   By: Burman Nieves M.D.   On: 04/16/2020 03:54   MR Lumbar Spine W Wo Contrast  Result Date: 04/16/2020 CLINICAL DATA:  Lower abdominal pain, nausea, fever EXAM: MRI LUMBAR SPINE WITHOUT AND WITH CONTRAST TECHNIQUE: Multiplanar and multiecho pulse sequences of the lumbar spine were obtained without and with intravenous contrast. CONTRAST:  47mL GADAVIST GADOBUTROL 1 MMOL/ML IV SOLN COMPARISON:  None. FINDINGS: Segmentation:  Standard. Alignment:  Physiologic. Vertebrae:  No fracture, evidence of discitis, or bone lesion. Conus medullaris and cauda equina: Conus extends to the L1 level. Conus and cauda equina appear normal. Paraspinal and other soft tissues: No acute paraspinal abnormality. Disc levels: Disc spaces: Disc spaces are maintained. T12-L1: No significant disc bulge. No evidence of neural foraminal stenosis. No central canal stenosis. L1-L2: No significant disc bulge. No evidence of neural foraminal stenosis. No central canal stenosis. L2-L3: No significant disc bulge. No evidence of neural foraminal stenosis. No central canal stenosis. L3-L4: Mild broad-based disc bulge with a small broad central disc protrusion. No evidence of neural foraminal stenosis. No central canal stenosis. L4-L5: No significant disc bulge. No evidence of neural foraminal stenosis. No central canal stenosis. L5-S1: No  significant disc bulge. No evidence of neural foraminal stenosis. No central canal stenosis. IMPRESSION: 1. No acute osseous injury of the lumbar spine. 2. No evidence of discitis or osteomyelitis of the lumbar spine. 3. At L3-4 there is a mild broad-based  disc bulge with a small broad central disc protrusion. Electronically Signed   By: Elige Ko   On: 04/16/2020 09:52   CT ABDOMEN PELVIS W CONTRAST  Result Date: 04/16/2020 CLINICAL DATA:  Lower abdominal pain, fever, and nausea. Abscess/infection suspected. EXAM: CT ABDOMEN AND PELVIS WITH CONTRAST TECHNIQUE: Multidetector CT imaging of the abdomen and pelvis was performed using the standard protocol following bolus administration of intravenous contrast. CONTRAST:  OMNIPAQUE IOHEXOL 300 MG/ML  SOLN COMPARISON:  None. FINDINGS: Lower chest: No basilar lung consolidation or pleural effusion. A few small subpleural nodules in the right lower lobe and right middle lobe measuring 2-3 mm in size are likely benign in a patient of this age without a history of malignancy. Hepatobiliary: Diffusely decreased attenuation of the liver compatible with steatosis. Small stones in the gallbladder with mild gallbladder wall thickening and/or small volume pericholecystic fluid. No biliary dilatation. Pancreas: Unremarkable. Spleen: Splenomegaly with an AP diameter of 15.1 cm. Adrenals/Urinary Tract: Unremarkable adrenal glands. No evidence of renal mass, calculi, or hydronephrosis. Excreted contrast material in the bladder likely related to the MRI earlier today. Stomach/Bowel: The stomach is unremarkable. There is no evidence of bowel obstruction or inflammation. The appendix is unremarkable. Vascular/Lymphatic: No significant vascular findings are present. No enlarged abdominal or pelvic lymph nodes. Reproductive: Unremarkable prostate. Other: Trace free fluid in the pelvis no pneumoperitoneum. Musculoskeletal: No acute osseous abnormality or suspicious osseous  lesion. IMPRESSION: 1. Cholelithiasis with mild gallbladder wall thickening and/or small volume pericholecystic fluid. Consider right upper quadrant ultrasound to further evaluate for possible cholecystitis. 2. Trace pelvic free fluid. 3. Hepatic steatosis. 4. Splenomegaly. Electronically Signed   By: Sebastian Ache M.D.   On: 04/16/2020 12:45   US Abdomen Limited RUQ  Result Date: 04/16/2020 CLINICAL DATA:  Right upper quadrant abdominal pain. EXAM: ULTRASOUND ABDOMEN LIMITED RIGHT UPPER QUADRANT COMPARISON:  None. FINDINGS: Gallbladder: There is mild gallbladder wall thickening. Multiple gallstones, measuring up to 0.5 cm. No sonographic Murphy sign noted by the sonographer. The gallbladder is not distended. Common bile duct: Diameter: 4.6 mm, within normal limits. Liver: No focal lesion identified. Within normal limits in parenchymal echogenicity. Portal vein is patent on color Doppler imaging with normal direction of blood flow towards the liver. IMPRESSION: Mild gallbladder wall thickening with gallstones. No gallbladder distension and negative sonographic Murphy sign per the technologist. Findings are equivocal for acute cholecystitis. If there is clinical uncertainty then a HIDA scan could further evaluate. Electronically Signed   By: Feliberto Harts MD   On: 04/16/2020 14:33     MDM   29 y/o M presenting for eval of fever, lower abd pain, and nausea.  Reviewed/interpreted labs CBC with mild leukocytosis, anemia, thrombocytopenia CMP with mild hyponatremia, mild hypokalemia, low bicarb without an elevated anion gap, elevated CBG and elevated LFTS (chronic, unchanged) Initial lactic acid, repeat lactic improved COVID neg UA neg for UTI Blood cultures obtained  CXR reviewed/interpreted - no active cardiopulmonary disease  At shift change, care assumed pending MRI lumbar spine to r/o abcess and CT abd/pelvis to r/o acute intraabdominal pathology.   MRI lumbar spine -  1. No acute osseous  injury of the lumbar spine. 2. No evidence of discitis or osteomyelitis of the lumbar spine. 3. At L3-4 there is a mild broad-based disc bulge with a small broad central disc protrusion.   CT abd/pelvis -  1. Cholelithiasis with mild gallbladder wall thickening and/or small volume pericholecystic fluid. Consider right upper quadrant ultrasound to further  evaluate for possible cholecystitis. 2. Trace pelvic free fluid. 3. Hepatic steatosis. 4. Splenomegaly.  Reassessed pt and he is denying any discomfort and is asking for food. He does have some ruq ttp on exam with guarding.   RUQ [place] - Mild gallbladder wall thickening with gallstones. No gallbladder distension and negative sonographic Murphy sign per the technologist. Findings are equivocal for acute cholecystitis. If there is clinical uncertainty then a HIDA scan could further evaluate.   3:20 PM CONSULT with Dr. Elnoria Howard with gastroenterology. Recommended HIDA and consult gen surg.  3:28 PM CONSULT With Barnetta Chapel with general surgery. She states that surgery should be reconsulted if HIDA is positive.   At shift change pending admission. Care signed out to [facility], [place], PA-C 04/16/20 1554    Long, Arlyss Repress, MD 04/17/20 [phone]

## 2020-04-16 NOTE — Assessment & Plan Note (Signed)
Continue nicotine patch  °

## 2020-04-16 NOTE — ED Provider Notes (Signed)
[facility]-EMERGENCY DEPT Provider Note   CSN: [id number] Arrival date & time: 04/16/20  7829     History Chief Complaint  Patient presents with  . Abdominal Pain    Frederick Franklin is a 29 y.o. male.  Patient to ED for evaluation of lower abdominal pain, fever, nausea without vomiting. No diarrhea. No SOB, cough or congestion. He has not received COVID vaccinations. 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 No dysuria but he reports his urine appears dark.   The history is provided by the patient. No language interpreter was used.       History reviewed. No pertinent past medical history.  Patient Active Problem List   Diagnosis Date Noted  . Cocaine abuse with cocaine-induced mood disorder (HCC) 03/22/2020  . Benzodiazepine abuse (HCC) 03/22/2020  . Severe recurrent major depression without psychotic features (HCC) 03/19/2020  . Polysubstance abuse (HCC) 03/17/2020  . Opioid use disorder, severe, dependence (HCC)     History reviewed. No pertinent surgical history.     History reviewed. No pertinent family history.  Social History   Tobacco Use  . Smoking status: Former Games developer  . Smokeless tobacco: Never Used  Vaping Use  . Vaping Use: Every day  Substance Use Topics  . Alcohol use: No  . Drug use: Never    Home Medications Prior to Admission medications   Medication Sig Start Date End Date Taking? Authorizing Provider  acetaminophen (TYLENOL) 500 MG tablet Take 1 tablet (500 mg total) by mouth every 6 (six) hours as needed. Patient not taking: Reported on 03/18/2020 12/18/18   Emi Holes, PA-C  famotidine (PEPCID) 20 MG tablet Take 1 tablet (20 mg total) by mouth 2 (two) times daily. Patient not taking: Reported on 03/18/2020 01/21/19   Robinson, [place] N, PA-C  hydrOXYzine (ATARAX/VISTARIL) 50 MG tablet Take 1 tablet (50 mg total) by mouth at bedtime. For anxiety/sleep 03/25/20   Armandina Stammer I, NP    nicotine polacrilex (NICORETTE) 2 MG gum Take 1 each (2 mg total) by mouth as needed. (May buy from over the counter): For smoking cessation 03/25/20   Armandina Stammer I, NP  QUEtiapine (SEROQUEL) 200 MG tablet Take 1 tablet (200 mg total) by mouth at bedtime. For mood control 03/25/20   Sanjuana Kava, NP    Allergies    Patient has no known allergies.  Review of Systems   Review of Systems  Constitutional: Positive for chills and fever.  HENT: Negative.   Eyes: Negative for visual disturbance.  Respiratory: Negative.  Negative for cough and shortness of breath.   Cardiovascular: Negative.   Gastrointestinal: Positive for abdominal pain and nausea. Negative for vomiting.  Genitourinary: Negative.  Negative for dysuria.  Musculoskeletal: Positive for myalgias.  Skin: Negative.  Negative for color change and wound.  Neurological: Negative.   Psychiatric/Behavioral: Negative for hallucinations and suicidal ideas.    Physical Exam Updated Vital Signs BP (!) 138/97   Pulse (!) 115   Temp (!) 103.1 F (39.5 C) (Oral)   Resp 18   SpO2 95%   Physical Exam Vitals and nursing note reviewed.  Constitutional:      Appearance: He is well-developed.  HENT:     Head: Normocephalic.  Cardiovascular:     Rate and Rhythm: Regular rhythm. Tachycardia present.     Heart sounds: No murmur heard.   Pulmonary:     Effort: Pulmonary effort is normal.  Breath sounds: Normal breath sounds. No wheezing, rhonchi or rales.  Chest:     Chest wall: No tenderness.  Abdominal:     General: There is no distension.     Palpations: Abdomen is soft.     Tenderness: There is abdominal tenderness (Mild) in the suprapubic area. There is no guarding or rebound.  Musculoskeletal:        General: Normal range of motion.     Cervical back: Normal range of motion and neck supple.  Skin:    General: Skin is warm and dry.     Findings: No rash.  Neurological:     Mental Status: He is alert and oriented to  person, place, and time.     ED Results / Procedures / Treatments   Labs (all labs ordered are listed, but only abnormal results are displayed) Labs Reviewed  CULTURE, BLOOD (ROUTINE X 2)  CULTURE, BLOOD (ROUTINE X 2)  RESPIRATORY PANEL BY RT PCR (FLU A&B, COVID)  COMPREHENSIVE METABOLIC PANEL  LACTIC ACID, PLASMA  LACTIC ACID, PLASMA  CBC WITH DIFFERENTIAL/PLATELET  PROTIME-INR  URINALYSIS, ROUTINE W REFLEX MICROSCOPIC   Results for orders placed or performed during the hospital encounter of 04/16/20  Respiratory Panel by RT PCR (Flu A&B, Covid) - Nasopharyngeal Swab   Specimen: Nasopharyngeal Swab  Result Value Ref Range   SARS Coronavirus 2 by RT PCR NEGATIVE NEGATIVE   Influenza A by PCR NEGATIVE NEGATIVE   Influenza B by PCR NEGATIVE NEGATIVE  Comprehensive metabolic panel  Result Value Ref Range   Sodium 134 (L) 135 - 145 mmol/L   Potassium 3.3 (L) 3.5 - 5.1 mmol/L   Chloride 105 98 - 111 mmol/L   CO2 20 (L) 22 - 32 mmol/L   Glucose, Bld 313 (H) 70 - 99 mg/dL   BUN 9 6 - 20 mg/dL   Creatinine, Ser 3.78 0.61 - 1.24 mg/dL   Calcium 8.2 (L) 8.9 - 10.3 mg/dL   Total Protein 6.9 6.5 - 8.1 g/dL   Albumin 3.1 (L) 3.5 - 5.0 g/dL   AST 94 (H) 15 - 41 U/L   ALT 82 (H) 0 - 44 U/L   Alkaline Phosphatase 98 38 - 126 U/L   Total Bilirubin 1.0 0.3 - 1.2 mg/dL   GFR calc non Af Amer >60 >60 mL/min   GFR calc Af Amer >60 >60 mL/min   Anion gap 9 5 - 15  Lactic acid, plasma  Result Value Ref Range   Lactic Acid, Venous 3.3 (HH) 0.5 - 1.9 mmol/L  CBC with Differential  Result Value Ref Range   WBC 11.4 (H) 4.0 - 10.5 K/uL   RBC 4.54 4.22 - 5.81 MIL/uL   Hemoglobin 12.4 (L) 13.0 - 17.0 g/dL   HCT 58.8 (L) 39 - 52 %   MCV 79.1 (L) 80.0 - 100.0 fL   MCH 27.3 26.0 - 34.0 pg   MCHC 34.5 30.0 - 36.0 g/dL   RDW 50.2 77.4 - 12.8 %   Platelets 133 (L) 150 - 400 K/uL   nRBC 0.0 0.0 - 0.2 %   Neutrophils Relative % 83 %   Neutro Abs 9.3 (H) 1.7 - 7.7 K/uL   Lymphocytes Relative 9  %   Lymphs Abs 1.1 0.7 - 4.0 K/uL   Monocytes Relative 7 %   Monocytes Absolute 0.8 0 - 1 K/uL   Eosinophils Relative 0 %   Eosinophils Absolute 0.0 0 - 0 K/uL   Basophils Relative 0 %  Basophils Absolute 0.0 0 - 0 K/uL   Immature Granulocytes 1 %   Abs Immature Granulocytes 0.10 (H) 0.00 - 0.07 K/uL  Protime-INR  Result Value Ref Range   Prothrombin Time 15.8 (H) 11.4 - 15.2 seconds   INR 1.3 (H) 0.8 - 1.2  Urinalysis, Routine w reflex microscopic Urine, Clean Catch  Result Value Ref Range   Color, Urine YELLOW YELLOW   APPearance CLEAR CLEAR   Specific Gravity, Urine 1.026 1.005 - 1.030   pH 5.0 5.0 - 8.0   Glucose, UA >=500 (A) NEGATIVE mg/dL   Hgb urine dipstick NEGATIVE NEGATIVE   Bilirubin Urine NEGATIVE NEGATIVE   Ketones, ur NEGATIVE NEGATIVE mg/dL   Protein, ur NEGATIVE NEGATIVE mg/dL   Nitrite NEGATIVE NEGATIVE   Leukocytes,Ua NEGATIVE NEGATIVE   WBC, UA 0-5 0 - 5 WBC/hpf   Bacteria, UA NONE SEEN NONE SEEN   Mucus PRESENT   CBG monitoring, ED  Result Value Ref Range   Glucose-Capillary 239 (H) 70 - 99 mg/dL    EKG None  Radiology DG Chest 2 View  Result Date: 04/16/2020 CLINICAL DATA:  Shortness of breath, fever, and weakness for a few days EXAM: CHEST - 2 VIEW COMPARISON:  12/18/2018 FINDINGS: The heart size and mediastinal contours are within normal limits. Both lungs are clear. The visualized skeletal structures are unremarkable. IMPRESSION: No active cardiopulmonary disease. Electronically Signed   By: Burman Nieves M.D.   On: 04/16/2020 03:54    Procedures Procedures (including critical care time)  Medications Ordered in ED Medications  acetaminophen (TYLENOL) tablet 650 mg (has no administration in time range)    ED Course  I have reviewed the triage vital signs and the nursing notes.  Pertinent labs & imaging results that were available during my care of the patient were reviewed by me and considered in my medical decision making (see chart  for details).    MDM Rules/Calculators/A&P                          Patient to ED for evaluation of lower abdominal pain, nausea, fever. Known IVDA.   Patient is overall well appearing. He presents with 103.1 fever, tachycardia to 115. Sepsis work up started. He is asking for food and drink.   VS improve with Tylenol for fever and IV fluids. On re-evaluation, abdominal pain is resolved. He is nontender on exam. He has a mild leukocytosis of 11.4. Of note, his lactic acid is 3.3 and glucose is 313 without h/o DM. No significant acidosis. Mild hypokalemia. CXR negative. COVID, influenza negative.   Of concern is history of IVDA and no identified source of infection. He complained of low abdominal pain that radiated to the back. Minimal abdominal tenderness and no lower back PE findings. However, secondary to risk, CT abd/pel and lumbar MR ordered for further evaluation.   Plan: Review imaging results and repeat labs (lactic acid) If imaging negative, consider discharge home, culture pending. Treat symptoms.   If imaging positive for infection, admit for further evaluation.  Antibiotics held until imaging reviewed.   Final Clinical Impression(s) / ED Diagnoses Final diagnoses:  None   1. Febrile illness 2. History of IVDA 3. Abdominal pain 4. Lumbar back pain  Rx / DC Orders ED Discharge Orders    None       Elpidio Anis, PA-C 04/16/20 9242    Shon Baton, MD 04/22/20 [phone]

## 2020-04-16 NOTE — Assessment & Plan Note (Signed)
-   recent psych notes reviewed - continue seroquel (patient did not fill at discharge)

## 2020-04-16 NOTE — Assessment & Plan Note (Addendum)
-   fever, tachycardia, lactic acid; source abdominal, presumably gallbladder - continue IVF - start on Zosyn -Follow-up cultures - obtain HIDA; depending on results will consult surgery if CCY indicated vs other options - low fat diet now then NPO at MN

## 2020-04-16 NOTE — Assessment & Plan Note (Signed)
-  No prior known history of diabetes but does have family history -Check A1c -Continue SSI and CBG monitoring

## 2020-04-16 NOTE — ED Triage Notes (Signed)
Patient is unvaccinated. Patient has used heroin this morning. Patient complaining of not feeling good, fever, abdominal pain, nausea.

## 2020-04-16 NOTE — Assessment & Plan Note (Signed)
Check iron stores 
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
## 2020-04-17 DIAGNOSIS — E876 Hypokalemia: Secondary | ICD-10-CM

## 2020-04-17 DIAGNOSIS — R1011 Right upper quadrant pain: Secondary | ICD-10-CM

## 2020-04-17 LAB — COMPREHENSIVE METABOLIC PANEL
ALT: 81 U/L — ABNORMAL HIGH (ref 0–44)
AST: 105 U/L — ABNORMAL HIGH (ref 15–41)
Albumin: 2.8 g/dL — ABNORMAL LOW (ref 3.5–5.0)
Alkaline Phosphatase: 78 U/L (ref 38–126)
Anion gap: 6 (ref 5–15)
BUN: 9 mg/dL (ref 6–20)
CO2: 24 mmol/L (ref 22–32)
Calcium: 8.1 mg/dL — ABNORMAL LOW (ref 8.9–10.3)
Chloride: 110 mmol/L (ref 98–111)
Creatinine, Ser: 0.56 mg/dL — ABNORMAL LOW (ref 0.61–1.24)
GFR calc Af Amer: >60 mL/min (ref >60)
GFR calc non Af Amer: >60 mL/min (ref >60)
Glucose, Bld: 117 mg/dL — ABNORMAL HIGH (ref 70–99)
Potassium: 3.3 mmol/L — ABNORMAL LOW (ref 3.5–5.1)
Sodium: 140 mmol/L (ref 135–145)
Total Bilirubin: 0.4 mg/dL (ref 0.3–1.2)
Total Protein: 6 g/dL — ABNORMAL LOW (ref 6.5–8.1)

## 2020-04-17 LAB — CBC WITH DIFFERENTIAL/PLATELET
Abs Immature Granulocytes: 0 10*3/uL (ref 0.00–0.07)
Basophils Absolute: 0 10*3/uL (ref 0.0–0.1)
Basophils Relative: 0 %
Eosinophils Absolute: 0.1 10*3/uL (ref 0.0–0.5)
Eosinophils Relative: 2 %
HCT: 33.5 % — ABNORMAL LOW (ref 39.0–52.0)
Hemoglobin: 11.3 g/dL — ABNORMAL LOW (ref 13.0–17.0)
Immature Granulocytes: 0 %
Lymphocytes Relative: 50 %
Lymphs Abs: 2.6 10*3/uL (ref 0.7–4.0)
MCH: 26.8 pg (ref 26.0–34.0)
MCHC: 33.7 g/dL (ref 30.0–36.0)
MCV: 79.6 fL — ABNORMAL LOW (ref 80.0–100.0)
Monocytes Absolute: 0.5 10*3/uL (ref 0.1–1.0)
Monocytes Relative: 9 %
Neutro Abs: 2.1 10*3/uL (ref 1.7–7.7)
Neutrophils Relative %: 39 %
Platelets: 116 10*3/uL — ABNORMAL LOW (ref 150–400)
RBC: 4.21 MIL/uL — ABNORMAL LOW (ref 4.22–5.81)
RDW: 14.1 % (ref 11.5–15.5)
WBC: 5.3 10*3/uL (ref 4.0–10.5)
nRBC: 0 % (ref 0.0–0.2)

## 2020-04-17 LAB — GLUCOSE, CAPILLARY
Glucose-Capillary: 208 mg/dL — ABNORMAL HIGH (ref 70–99)
Glucose-Capillary: 251 mg/dL — ABNORMAL HIGH (ref 70–99)
Glucose-Capillary: 190 mg/dL — ABNORMAL HIGH (ref 70–99)

## 2020-04-17 LAB — HEMOGLOBIN A1C
Hgb A1c MFr Bld: 6.3 % — ABNORMAL HIGH (ref 4.8–5.6)
Mean Plasma Glucose: 134.11 mg/dL

## 2020-04-17 LAB — CBG MONITORING, ED: Glucose-Capillary: 119 mg/dL — ABNORMAL HIGH (ref 70–99)

## 2020-04-17 LAB — LIPASE, BLOOD: Lipase: 37 U/L (ref 11–51)

## 2020-04-17 LAB — MAGNESIUM: Magnesium: 1.9 mg/dL (ref 1.7–2.4)

## 2020-04-17 MED ORDER — POTASSIUM CHLORIDE CRYS ER 20 MEQ PO TBCR
40.0000 meq | EXTENDED_RELEASE_TABLET | Freq: Once | ORAL | Status: AC
  Administered 2020-04-17: 40 meq via ORAL
  Filled 2020-04-17: qty 2

## 2020-04-17 MED ORDER — HYDROXYZINE HCL 25 MG PO TABS
25.0000 mg | ORAL_TABLET | Freq: Three times a day (TID) | ORAL | Status: AC | PRN
  Administered 2020-04-17 – 2020-04-18 (×2): 25 mg via ORAL
  Filled 2020-04-17 (×2): qty 1

## 2020-04-17 NOTE — Progress Notes (Signed)
[facility] PROGRESS NOTE   Frederick Franklin WUJ:[id number] DOB: 09-26-1990 DOA: 04/16/2020  PCP: Patient, No Pcp Per  Brief History/Interval Summary: 29 year old Caucasian male with a past medical history of substance abuse, history of depression presented to the ER with complaints of abdominal pain.  He also had fever at home.  Pain was located in the right abdomen.  Patient mentioned that the pain was radiating to his back.  In the ER he underwent extensive work-up including chest x-ray which was unremarkable.  MRI of the lumbar spine revealed a bulging disc at L3-L4.  CT abdomen revealed cholelithiasis with mild gallbladder wall thickening and small pericholecystic fluid.  Hepatic steatosis and splenomegaly was also noted.  Ultrasound of the right upper quadrant also revealed mild gallbladder thickening with cholelithiasis.  HIDA scan was recommended.  Reason for Visit: Abdominal pain  Consultants: None yet  Procedures: None yet  Antibiotics: Anti-infectives (From admission, onward)   Start     Dose/Rate Route Frequency Ordered Stop   04/16/20 1815  piperacillin-tazobactam (ZOSYN) IVPB 3.375 g        3.375 g 12.5 mL/hr over 240 Minutes Intravenous Every 8 hours 04/16/20 1805        Subjective/Interval History: Patient continues to with 6 out of 10 pain in the right upper abdomen.  Some nausea but no vomiting.     Assessment/Plan:  Severe sepsis, present on admission Patient presented with fever tachycardia leukocytosis lactic acidosis and possible abdominal source of infection.  Work-up is in progress as discussed below.  Patient started on Zosyn which will be continued.  Follow-up on cultures.  Abdominal pain with concern for acute cholecystitis/transaminitis Patient currently on Zosyn.  Ultrasound was equivocal.  HIDA scan is pending.  Pain medications as needed.  AST ALT noted to be elevated.  Alkaline phosphatase and bilirubin are normal.  Check lipase.  Check hepatitis  panel.  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  Hyperglycemia Noted to have glucose level of 313.  CBGs noted to be elevated.  No known history of diabetes.  HbA1c 6.3.  Tobacco abuse Nicotine patch.  Microcytic anemia/mild thrombocytopenia Check anemia panel.  Monitor platelet counts closely.  Hypokalemia Replete.  Magnesium 1.9.  L3-L4 disc bulging This was mild.  Noted on MRI.  No symptoms currently.   DVT Prophylaxis: Lovenox Code Status: Full code Family Communication: Discussed with the patient Disposition Plan: Hopefully return home when improved  Status is: Inpatient  Remains inpatient appropriate because:Ongoing diagnostic testing needed not appropriate for outpatient work up and IV treatments appropriate due to intensity of illness or inability to take PO   Dispo: The patient is from: Home              Anticipated d/c is to: Home              Anticipated d/c date is: 2 days              Patient currently is not medically stable to d/c.       Medications:  Scheduled: . enoxaparin (LOVENOX) injection  40 mg Subcutaneous QHS  . insulin aspart  0-15 Units Subcutaneous TID AC & HS  . nicotine  21 mg Transdermal Daily  . QUEtiapine  200 mg Oral QHS   Continuous: . lactated ringers 100 mL/hr at 04/17/20 1202  . piperacillin-tazobactam (ZOSYN)  IV Stopped (04/17/20 0800)   NFA:OZHYQMVHQIONG, morphine injection, ondansetron **OR** ondansetron (ZOFRAN) IV   Objective:  Vital  Signs  Vitals:   04/17/20 0759 04/17/20 0800 04/17/20 0830 04/17/20 0900  BP: (!) 132/93 122/88 (!) 129/91 127/80  Pulse: 78 64 85 94  Resp: 15     Temp: 98.2 F (36.8 C)     TempSrc: Oral     SpO2: 98% 97% 99% 98%  Weight:      Height:        Intake/Output Summary (Last 24 hours) at 04/17/2020 1311 Last data filed at 04/17/2020 1036 Gross per 24 hour  Intake --  Output 0 ml  Net 0 ml   Filed Weights   04/16/20 0524   Weight: 97.5 kg    General appearance: Awake alert.  In no distress Resp: Clear to auscultation bilaterally.  Normal effort Cardio: S1-S2 is normal regular.  No S3-S4.  No rubs murmurs or bruit GI: Abdomen is soft.  Tenderness in the right upper quadrant without rebound rigidity or guarding.  Eulah Pont sign is equivocal.  No masses organomegaly.  Bowel sounds present. Extremities: No edema.  Full range of motion of lower extremities. Neurologic: Alert and oriented x3.  No focal neurological deficits.    Lab Results:  Data Reviewed: I have personally reviewed following labs and imaging studies  CBC: Recent Labs  Lab 04/16/20 0409 04/17/20 0444  WBC 11.4* 5.3  NEUTROABS 9.3* 2.1  HGB 12.4* 11.3*  HCT 35.9* 33.5*  MCV 79.1* 79.6*  PLT 133* 116*    Basic Metabolic Panel: Recent Labs  Lab 04/16/20 0409 04/17/20 0444  NA 134* 140  K 3.3* 3.3*  CL 105 110  CO2 20* 24  GLUCOSE 313* 117*  BUN 9 9  CREATININE 0.89 0.56*  CALCIUM 8.2* 8.1*  MG  --  1.9    GFR: Estimated Creatinine Clearance: 163.7 mL/min (A) (by C-G formula based on SCr of 0.56 mg/dL (L)).  Liver Function Tests: Recent Labs  Lab 04/16/20 0409 04/17/20 0444  AST 94* 105*  ALT 82* 81*  ALKPHOS 98 78  BILITOT 1.0 0.4  PROT 6.9 6.0*  ALBUMIN 3.1* 2.8*     Coagulation Profile: Recent Labs  Lab 04/16/20 0409  INR 1.3*     HbA1C: Recent Labs    04/17/20 0444  HGBA1C 6.3*    CBG: Recent Labs  Lab 04/16/20 0710 04/16/20 1922 04/16/20 2214 04/17/20 0755 04/17/20 1218  GLUCAP 239* 115* 108* 119* 208*      Recent Results (from the past 240 hour(s))  Culture, blood (Routine x 2)     Status: None (Preliminary result)   Collection Time: 04/16/20  4:09 AM   Specimen: BLOOD  Result Value Ref Range Status   Specimen Description   Final    BLOOD LEFT ANTECUBITAL Performed at [facility], [address] [place], [place] [address]    Special Requests   Final     BOTTLES DRAWN AEROBIC AND ANAEROBIC Blood Culture adequate volume Performed at [facility] [address], [place], [place] [address]    Culture   Final    NO GROWTH < 24 HOURS Performed at [facility], [address], [place], [place] [address]    Report Status PENDING  Incomplete  Respiratory Panel by RT PCR (Flu A&B, Covid) - Nasopharyngeal Swab     Status: None   Collection Time: 04/16/20  4:11 AM   Specimen: Nasopharyngeal Swab  Result Value Ref Range Status   SARS Coronavirus 2 by RT PCR NEGATIVE NEGATIVE Final    Comment: (  NOTE) SARS-CoV-2 target nucleic acids are NOT DETECTED.  The SARS-CoV-2 RNA is generally detectable in upper respiratoy specimens during the acute phase of infection. The lowest concentration of SARS-CoV-2 viral copies this assay can detect is 131 copies/mL. A negative result does not preclude SARS-Cov-2 infection and should not be used as the sole basis for treatment or other patient management decisions. A negative result may occur with  improper specimen collection/handling, submission of specimen other than nasopharyngeal swab, presence of viral mutation(s) within the areas targeted by this assay, and inadequate number of viral copies (<131 copies/mL). A negative result must be combined with clinical observations, patient history, and epidemiological information. The expected result is Negative.  Fact Sheet for Patients:  https://www.moore.com/  Fact Sheet for Healthcare Providers:  https://www.young.biz/  This test is no t yet approved or cleared by the [place] FDA and  has been authorized for detection and/or diagnosis of SARS-CoV-2 by FDA under an Emergency Use Authorization (EUA). This EUA will remain  in effect (meaning this test can be used) for the duration of the COVID-19 declaration under Section 564(b)(1) of the Act, 21 U.S.C. section 360bbb-3(b)(1), unless the  authorization is terminated or revoked sooner.     Influenza A by PCR NEGATIVE NEGATIVE Final   Influenza B by PCR NEGATIVE NEGATIVE Final    Comment: (NOTE) The Xpert Xpress SARS-CoV-2/FLU/RSV assay is intended as an aid in  the diagnosis of influenza from Nasopharyngeal swab specimens and  should not be used as a sole basis for treatment. Nasal washings and  aspirates are unacceptable for Xpert Xpress SARS-CoV-2/FLU/RSV  testing.  Fact Sheet for Patients: https://www.moore.com/  Fact Sheet for Healthcare Providers: https://www.young.biz/  This test is not yet approved or cleared by the [place] FDA and  has been authorized for detection and/or diagnosis of SARS-CoV-2 by  FDA under an Emergency Use Authorization (EUA). This EUA will remain  in effect (meaning this test can be used) for the duration of the  Covid-19 declaration under Section 564(b)(1) of the Act, 21  U.S.C. section 360bbb-3(b)(1), unless the authorization is  terminated or revoked. Performed at [facility], [address] [place], [place] [address]   Culture, blood (Routine x 2)     Status: None (Preliminary result)   Collection Time: 04/16/20  4:11 PM   Specimen: BLOOD RIGHT HAND  Result Value Ref Range Status   Specimen Description   Final    BLOOD RIGHT HAND Performed at [facility], [address], [place], [place] [address]    Special Requests   Final    BOTTLES DRAWN AEROBIC AND ANAEROBIC Blood Culture adequate volume Performed at [facility], [address] [place], [place] [address]    Culture   Final    NO GROWTH < 12 HOURS Performed at [facility], [address], [place], [place] [address]    Report Status PENDING  Incomplete      Radiology Studies: DG Chest 2 View  Result Date: 04/16/2020 CLINICAL DATA:  Shortness of breath, fever, and weakness for a few days EXAM: CHEST - 2  VIEW COMPARISON:  12/18/2018 FINDINGS: The heart size and mediastinal contours are within normal limits. Both lungs are clear. The visualized skeletal structures are unremarkable. IMPRESSION: No active cardiopulmonary disease. Electronically Signed   By: Burman Nieves M.D.   On: 04/16/2020 03:54   MR Lumbar Spine W Wo Contrast  Result Date: 04/16/2020 CLINICAL DATA:  Lower abdominal  pain, nausea, fever EXAM: MRI LUMBAR SPINE WITHOUT AND WITH CONTRAST TECHNIQUE: Multiplanar and multiecho pulse sequences of the lumbar spine were obtained without and with intravenous contrast. CONTRAST:  35mL GADAVIST GADOBUTROL 1 MMOL/ML IV SOLN COMPARISON:  None. FINDINGS: Segmentation:  Standard. Alignment:  Physiologic. Vertebrae:  No fracture, evidence of discitis, or bone lesion. Conus medullaris and cauda equina: Conus extends to the L1 level. Conus and cauda equina appear normal. Paraspinal and other soft tissues: No acute paraspinal abnormality. Disc levels: Disc spaces: Disc spaces are maintained. T12-L1: No significant disc bulge. No evidence of neural foraminal stenosis. No central canal stenosis. L1-L2: No significant disc bulge. No evidence of neural foraminal stenosis. No central canal stenosis. L2-L3: No significant disc bulge. No evidence of neural foraminal stenosis. No central canal stenosis. L3-L4: Mild broad-based disc bulge with a small broad central disc protrusion. No evidence of neural foraminal stenosis. No central canal stenosis. L4-L5: No significant disc bulge. No evidence of neural foraminal stenosis. No central canal stenosis. L5-S1: No significant disc bulge. No evidence of neural foraminal stenosis. No central canal stenosis. IMPRESSION: 1. No acute osseous injury of the lumbar spine. 2. No evidence of discitis or osteomyelitis of the lumbar spine. 3. At L3-4 there is a mild broad-based disc bulge with a small broad central disc protrusion. Electronically Signed   By: Elige Ko   On:  04/16/2020 09:52   CT ABDOMEN PELVIS W CONTRAST  Result Date: 04/16/2020 CLINICAL DATA:  Lower abdominal pain, fever, and nausea. Abscess/infection suspected. EXAM: CT ABDOMEN AND PELVIS WITH CONTRAST TECHNIQUE: Multidetector CT imaging of the abdomen and pelvis was performed using the standard protocol following bolus administration of intravenous contrast. CONTRAST:  OMNIPAQUE IOHEXOL 300 MG/ML  SOLN COMPARISON:  None. FINDINGS: Lower chest: No basilar lung consolidation or pleural effusion. A few small subpleural nodules in the right lower lobe and right middle lobe measuring 2-3 mm in size are likely benign in a patient of this age without a history of malignancy. Hepatobiliary: Diffusely decreased attenuation of the liver compatible with steatosis. Small stones in the gallbladder with mild gallbladder wall thickening and/or small volume pericholecystic fluid. No biliary dilatation. Pancreas: Unremarkable. Spleen: Splenomegaly with an AP diameter of 15.1 cm. Adrenals/Urinary Tract: Unremarkable adrenal glands. No evidence of renal mass, calculi, or hydronephrosis. Excreted contrast material in the bladder likely related to the MRI earlier today. Stomach/Bowel: The stomach is unremarkable. There is no evidence of bowel obstruction or inflammation. The appendix is unremarkable. Vascular/Lymphatic: No significant vascular findings are present. No enlarged abdominal or pelvic lymph nodes. Reproductive: Unremarkable prostate. Other: Trace free fluid in the pelvis no pneumoperitoneum. Musculoskeletal: No acute osseous abnormality or suspicious osseous lesion. IMPRESSION: 1. Cholelithiasis with mild gallbladder wall thickening and/or small volume pericholecystic fluid. Consider right upper quadrant ultrasound to further evaluate for possible cholecystitis. 2. Trace pelvic free fluid. 3. Hepatic steatosis. 4. Splenomegaly. Electronically Signed   By: Sebastian Ache M.D.   On: 04/16/2020 12:45   US Abdomen  Limited RUQ  Result Date: 04/16/2020 CLINICAL DATA:  Right upper quadrant abdominal pain. EXAM: ULTRASOUND ABDOMEN LIMITED RIGHT UPPER QUADRANT COMPARISON:  None. FINDINGS: Gallbladder: There is mild gallbladder wall thickening. Multiple gallstones, measuring up to 0.5 cm. No sonographic Murphy sign noted by the sonographer. The gallbladder is not distended. Common bile duct: Diameter: 4.6 mm, within normal limits. Liver: No focal lesion identified. Within normal limits in parenchymal echogenicity. Portal vein is patent on color Doppler imaging with normal direction of  blood flow towards the liver. IMPRESSION: Mild gallbladder wall thickening with gallstones. No gallbladder distension and negative sonographic Murphy sign per the technologist. Findings are equivocal for acute cholecystitis. If there is clinical uncertainty then a HIDA scan could further evaluate. Electronically Signed   By: Feliberto HartsFrederick S Jones MD   On: 04/16/2020 14:33       LOS: 1 day   Frederick Franklin  [facility] Pager on [email or web address]  04/17/2020, 1:11 PM

## 2020-04-18 ENCOUNTER — Inpatient Hospital Stay (HOSPITAL_COMMUNITY): Payer: Self-pay

## 2020-04-18 LAB — RETICULOCYTES
Immature Retic Fract: 12.4 % (ref 2.3–15.9)
RBC.: 4.91 MIL/uL (ref 4.22–5.81)
Retic Count, Absolute: 62.4 10*3/uL (ref 19.0–186.0)
Retic Ct Pct: 1.3 % (ref 0.4–3.1)

## 2020-04-18 LAB — HEPATITIS PANEL, ACUTE
HCV Ab: REACTIVE — AB
Hep A IgM: NONREACTIVE
Hep B C IgM: NONREACTIVE
Hepatitis B Surface Ag: NONREACTIVE

## 2020-04-18 LAB — CBC WITH DIFFERENTIAL/PLATELET
Abs Immature Granulocytes: 0.07 10*3/uL (ref 0.00–0.07)
Basophils Absolute: 0 10*3/uL (ref 0.0–0.1)
Basophils Relative: 0 %
Eosinophils Absolute: 0.1 10*3/uL (ref 0.0–0.5)
Eosinophils Relative: 2 %
HCT: 39.4 % (ref 39.0–52.0)
Hemoglobin: 12.8 g/dL — ABNORMAL LOW (ref 13.0–17.0)
Immature Granulocytes: 1 %
Lymphocytes Relative: 36 %
Lymphs Abs: 2.2 10*3/uL (ref 0.7–4.0)
MCH: 26.1 pg (ref 26.0–34.0)
MCHC: 32.5 g/dL (ref 30.0–36.0)
MCV: 80.4 fL (ref 80.0–100.0)
Monocytes Absolute: 0.4 10*3/uL (ref 0.1–1.0)
Monocytes Relative: 6 %
Neutro Abs: 3.4 10*3/uL (ref 1.7–7.7)
Neutrophils Relative %: 55 %
Platelets: 152 10*3/uL (ref 150–400)
RBC: 4.9 MIL/uL (ref 4.22–5.81)
RDW: 14.2 % (ref 11.5–15.5)
WBC: 6.2 10*3/uL (ref 4.0–10.5)
nRBC: 0 % (ref 0.0–0.2)

## 2020-04-18 LAB — COMPREHENSIVE METABOLIC PANEL
ALT: 84 U/L — ABNORMAL HIGH (ref 0–44)
AST: 91 U/L — ABNORMAL HIGH (ref 15–41)
Albumin: 3.1 g/dL — ABNORMAL LOW (ref 3.5–5.0)
Alkaline Phosphatase: 82 U/L (ref 38–126)
Anion gap: 8 (ref 5–15)
BUN: <5 mg/dL — ABNORMAL LOW (ref 6–20)
CO2: 25 mmol/L (ref 22–32)
Calcium: 8.7 mg/dL — ABNORMAL LOW (ref 8.9–10.3)
Chloride: 106 mmol/L (ref 98–111)
Creatinine, Ser: 0.66 mg/dL (ref 0.61–1.24)
GFR calc Af Amer: >60 mL/min (ref >60)
GFR calc non Af Amer: >60 mL/min (ref >60)
Glucose, Bld: 133 mg/dL — ABNORMAL HIGH (ref 70–99)
Potassium: 3.8 mmol/L (ref 3.5–5.1)
Sodium: 139 mmol/L (ref 135–145)
Total Bilirubin: 0.9 mg/dL (ref 0.3–1.2)
Total Protein: 7 g/dL (ref 6.5–8.1)

## 2020-04-18 LAB — GLUCOSE, CAPILLARY
Glucose-Capillary: 118 mg/dL — ABNORMAL HIGH (ref 70–99)
Glucose-Capillary: 100 mg/dL — ABNORMAL HIGH (ref 70–99)
Glucose-Capillary: 122 mg/dL — ABNORMAL HIGH (ref 70–99)

## 2020-04-18 LAB — IRON AND TIBC
Iron: 65 ug/dL (ref 45–182)
Saturation Ratios: 16 % — ABNORMAL LOW (ref 17.9–39.5)
TIBC: 396 ug/dL (ref 250–450)
UIBC: 331 ug/dL

## 2020-04-18 LAB — MAGNESIUM: Magnesium: 2.1 mg/dL (ref 1.7–2.4)

## 2020-04-18 LAB — FOLATE: Folate: 11.1 ng/mL (ref >5.9)

## 2020-04-18 LAB — VITAMIN B12: Vitamin B-12: 493 pg/mL (ref 180–914)

## 2020-04-18 LAB — FERRITIN: Ferritin: 76 ng/mL (ref 24–336)

## 2020-04-18 MED ORDER — FAMOTIDINE 20 MG PO TABS
20.0000 mg | ORAL_TABLET | Freq: Every day | ORAL | Status: DC
  Administered 2020-04-18: 20 mg via ORAL
  Filled 2020-04-18: qty 1

## 2020-04-18 MED ORDER — TECHNETIUM TC 99M MEBROFENIN IV KIT
5.3000 | PACK | Freq: Once | INTRAVENOUS | Status: AC | PRN
  Administered 2020-04-18: 5.3 via INTRAVENOUS

## 2020-04-18 MED ORDER — MORPHINE SULFATE (PF) 2 MG/ML IV SOLN
2.0000 mg | INTRAVENOUS | Status: DC | PRN
  Administered 2020-04-18 – 2020-04-19 (×4): 2 mg via INTRAVENOUS
  Filled 2020-04-18 (×4): qty 1

## 2020-04-18 NOTE — Consult Note (Signed)
[facility] Consult Note  Frederick GellDillon Picou November 01, 1990  [id number].    Requesting MD: Osvaldo ShipperGokul Krishnan Chief Complaint: abdominal pain and nausea Reason for Consult: possible biliary symptoms  HPI:  Pt is a 29 y/o with recent hospitalization 8/31-03/25/20 for cocaine abuse with cocaine-induced mood disorder.  He presented to the ED on 9/28/212 with complaints of using heroin and not feeling good.  Complaints of fever, abdominal pain and nausea.  He says he has had intermittent symptoms over the last 2 weeks, associated with oral intake, mostly pizza.  He also reports light brown stools.  He has significant GERD symptoms that are daily also. Fever at home reported up to 101.5.   Work up in the ED:  single temp 103.1,other temps were normal, VSS.  CMP: NA 134, potassium 3.3, glucose 313, AST 94 ALT 82, total bilirubin 1.0.  Lactate 1.8, WBC 11.4, hemoglobin 12.4, hematocrit 35.9, platelets 133,000.  Covid screen influenza screen negative.  Hepatitis A, hepatitis B antibody negative.  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  Urinalysis is unremarkable.  Abdominal CT shows cholelithiasis with mild gallbladder wall thickening and/or small pericholecystic fluid volume.  A right upper quadrant ultrasound shows there is mild gallbladder wall thickening, cholelithiasis, negative Murphy sign, CBD was 4.6 mm.  Liver showed no focal lesions, normal-appearing parenchyma portal vein is patent on color Doppler with normal directional blood flow towards the liver.  A HIDA scan was obtained today.  It shows patency of the cystic and common bile ducts was demonstrated.  Ejection fraction is 69% bile reflux into this stomach was noted.   We are asked to see for possible cholecystitis/biliary symptoms   ROS: Review of Systems  Constitutional: Positive for fever.  HENT: Negative.   Eyes: Negative.   Respiratory: Negative.   Cardiovascular: Negative.   Gastrointestinal: Positive for abdominal pain, diarrhea (light brown  stools), heartburn and nausea. Negative for blood in stool, constipation and melena.  Genitourinary: Negative.   Musculoskeletal: Negative.   Skin: Negative.   Neurological: Positive for dizziness and headaches.  Endo/Heme/Allergies: Negative.   Psychiatric/Behavioral: Positive for depression.    Family History  Problem Relation Age of Onset  . Diabetes Paternal Grandmother     Past Medical History:  Diagnosis Date  . Depression Opioid use disorder, severe, dependence (HCC)   Polysubstance abuse (HCC)   Severe recurrent major depression without psychotic features (HCC)   Benzodiazepine abuse (HCC)     History reviewed. No pertinent surgical history.  Social History:   Tobacco 1 PPD x 13 years; he quit smoking. Hx of polysubstance use, opoid use disorder, benzodiazepine abuse. ETOH:  none Lives with Mother and works about 2 days a week.   Allergies: No Known Allergies  Medications Prior to Admission  Medication Sig Dispense Refill  . acetaminophen (TYLENOL) 325 MG tablet Take 650-975 mg by mouth every 6 (six) hours as needed for mild pain, fever or headache.    . QUEtiapine (SEROQUEL) 200 MG tablet Take 1 tablet (200 mg total) by mouth at bedtime. For mood control (Patient not taking: Reported on 04/16/2020) 30 tablet 0    Blood pressure 139/85, pulse 80, temperature 98.8 F (37.1 C), temperature source Oral, resp. rate 17, height 5\' 11"  (1.803 m), weight 95.3 kg, SpO2 99 %. Physical Exam:  General: pleasant, WD, WN white male over weight who is laying in bed in NAD, reports pain 6/10. HEENT: head is normocephalic, atraumatic.  Sclera are noninjected.  PERRL.  Ears and nose without any masses  or lesions.  Mouth is pink and moist Heart: regular, rate, and rhythm.  Normal s1,s2. No obvious murmurs, gallops, or rubs noted.  Palpable radial and pedal pulses bilaterally Lungs: CTAB, no wheezes, rhonchi, or rales noted.  Respiratory effort nonlabored Abd: soft, some tenderness  RUQ, ND, +BS, no masses, hernias, or organomegaly MS: all 4 extremities are symmetrical with no cyanosis, clubbing, or edema. Skin: warm and dry with no masses, lesions, or rashes Neuro: Cranial nerves 2-12 grossly intact, sensation is normal throughout Psych: A&Ox3 with an appropriate affect.   Results for orders placed or performed during the hospital encounter of 04/16/20 (from the past 48 hour(s))  Culture, blood (Routine x 2)     Status: None (Preliminary result)   Collection Time: 04/16/20  4:11 PM   Specimen: BLOOD RIGHT HAND  Result Value Ref Range   Specimen Description      BLOOD RIGHT HAND Performed at [facility], [address], [place], [place] [address]    Special Requests      BOTTLES DRAWN AEROBIC AND ANAEROBIC Blood Culture adequate volume Performed at [facility], [address], [place], [place] [address]    Culture      NO GROWTH 2 DAYS Performed at [facility], [address], [place], [place] [address]    Report Status PENDING   CBG monitoring, ED     Status: Abnormal   Collection Time: 04/16/20  7:22 PM  Result Value Ref Range   Glucose-Capillary 115 (H) 70 - 99 mg/dL    Comment: Glucose reference range applies only to samples taken after fasting for at least 8 hours.  CBG monitoring, ED     Status: Abnormal   Collection Time: 04/16/20 10:14 PM  Result Value Ref Range   Glucose-Capillary 108 (H) 70 - 99 mg/dL    Comment: Glucose reference range applies only to samples taken after fasting for at least 8 hours.   Comment 1 Notify RN   Hemoglobin A1c     Status: Abnormal   Collection Time: 04/17/20  4:44 AM  Result Value Ref Range   Hgb A1c MFr Bld 6.3 (H) 4.8 - 5.6 %    Comment: (NOTE) Pre diabetes:          5.7%-6.4%  Diabetes:              >6.4%  Glycemic control for   <7.0% adults with diabetes    Mean Plasma Glucose 134.11 mg/dL    Comment: Performed at [facility], [address]  [place], [place] [address]  CBC with Differential/Platelet     Status: Abnormal   Collection Time: 04/17/20  4:44 AM  Result Value Ref Range   WBC 5.3 4.0 - 10.5 K/uL   RBC 4.21 (L) 4.22 - 5.81 MIL/uL   Hemoglobin 11.3 (L) 13.0 - 17.0 g/dL   HCT 35.4 (L) 39 - 52 %   MCV 79.6 (L) 80.0 - 100.0 fL   MCH 26.8 26.0 - 34.0 pg   MCHC 33.7 30.0 - 36.0 g/dL   RDW 56.2 56.3 - 89.3 %   Platelets 116 (L) 150 - 400 K/uL    Comment: REPEATED TO VERIFY PLATELET COUNT CONFIRMED BY SMEAR SPECIMEN CHECKED FOR CLOTS Immature Platelet Fraction may be clinically indicated, consider ordering this additional test [id number]    nRBC 0.0 0.0 - 0.2 %   Neutrophils Relative % 39 %   Neutro Abs 2.1 1.7 - 7.7 K/uL  Lymphocytes Relative 50 %   Lymphs Abs 2.6 0.7 - 4.0 K/uL   Monocytes Relative 9 %   Monocytes Absolute 0.5 0 - 1 K/uL   Eosinophils Relative 2 %   Eosinophils Absolute 0.1 0 - 0 K/uL   Basophils Relative 0 %   Basophils Absolute 0.0 0 - 0 K/uL   Immature Granulocytes 0 %   Abs Immature Granulocytes 0.00 0.00 - 0.07 K/uL    Comment: Performed at [facility], [address] [place], [place] [address]  Comprehensive metabolic panel     Status: Abnormal   Collection Time: 04/17/20  4:44 AM  Result Value Ref Range   Sodium 140 135 - 145 mmol/L   Potassium 3.3 (L) 3.5 - 5.1 mmol/L   Chloride 110 98 - 111 mmol/L   CO2 24 22 - 32 mmol/L   Glucose, Bld 117 (H) 70 - 99 mg/dL    Comment: Glucose reference range applies only to samples taken after fasting for at least 8 hours.   BUN 9 6 - 20 mg/dL   Creatinine, Ser 5.63 (L) 0.61 - 1.24 mg/dL   Calcium 8.1 (L) 8.9 - 10.3 mg/dL   Total Protein 6.0 (L) 6.5 - 8.1 g/dL   Albumin 2.8 (L) 3.5 - 5.0 g/dL   AST 875 (H) 15 - 41 U/L   ALT 81 (H) 0 - 44 U/L   Alkaline Phosphatase 78 38 - 126 U/L   Total Bilirubin 0.4 0.3 - 1.2 mg/dL   GFR calc non Af Amer >60 >60 mL/min   GFR calc Af Amer >60 >60 mL/min   Anion gap 6 5 - 15    Comment:  Performed at [facility], [address] [place], [place] [address]  Magnesium     Status: None   Collection Time: 04/17/20  4:44 AM  Result Value Ref Range   Magnesium 1.9 1.7 - 2.4 mg/dL    Comment: Performed at [facility], [address], [place], [place] [address]  Lipase, blood     Status: None   Collection Time: 04/17/20  4:44 AM  Result Value Ref Range   Lipase 37 11 - 51 U/L    Comment: Performed at [facility], [address] [place], [place] [address]  CBG monitoring, ED     Status: Abnormal   Collection Time: 04/17/20  7:55 AM  Result Value Ref Range   Glucose-Capillary 119 (H) 70 - 99 mg/dL    Comment: Glucose reference range applies only to samples taken after fasting for at least 8 hours.  Glucose, capillary     Status: Abnormal   Collection Time: 04/17/20 12:18 PM  Result Value Ref Range   Glucose-Capillary 208 (H) 70 - 99 mg/dL    Comment: Glucose reference range applies only to samples taken after fasting for at least 8 hours.  Glucose, capillary     Status: Abnormal   Collection Time: 04/17/20  5:25 PM  Result Value Ref Range   Glucose-Capillary 251 (H) 70 - 99 mg/dL    Comment: Glucose reference range applies only to samples taken after fasting for at least 8 hours.  Glucose, capillary     Status: Abnormal   Collection Time: 04/17/20  8:51 PM  Result Value Ref Range   Glucose-Capillary 190 (H) 70 - 99 mg/dL    Comment: Glucose reference range applies only to samples taken after fasting for at least 8 hours.   Comment 1  Notify RN    Comment 2 Document in Chart   CBC with Differential/Platelet     Status: Abnormal   Collection Time: 04/18/20  5:28 AM  Result Value Ref Range   WBC 6.2 4.0 - 10.5 K/uL   RBC 4.90 4.22 - 5.81 MIL/uL   Hemoglobin 12.8 (L) 13.0 - 17.0 g/dL   HCT 16.1 39 - 52 %   MCV 80.4 80.0 - 100.0 fL   MCH 26.1 26.0 - 34.0 pg   MCHC 32.5 30.0 - 36.0 g/dL   RDW 09.6 04.5 - 40.9 %    Platelets 152 150 - 400 K/uL   nRBC 0.0 0.0 - 0.2 %   Neutrophils Relative % 55 %   Neutro Abs 3.4 1.7 - 7.7 K/uL   Lymphocytes Relative 36 %   Lymphs Abs 2.2 0.7 - 4.0 K/uL   Monocytes Relative 6 %   Monocytes Absolute 0.4 0 - 1 K/uL   Eosinophils Relative 2 %   Eosinophils Absolute 0.1 0 - 0 K/uL   Basophils Relative 0 %   Basophils Absolute 0.0 0 - 0 K/uL   Immature Granulocytes 1 %   Abs Immature Granulocytes 0.07 0.00 - 0.07 K/uL    Comment: Performed at [facility], [address], [place], [place] [address]  Comprehensive metabolic panel     Status: Abnormal   Collection Time: 04/18/20  5:28 AM  Result Value Ref Range   Sodium 139 135 - 145 mmol/L   Potassium 3.8 3.5 - 5.1 mmol/L   Chloride 106 98 - 111 mmol/L   CO2 25 22 - 32 mmol/L   Glucose, Bld 133 (H) 70 - 99 mg/dL    Comment: Glucose reference range applies only to samples taken after fasting for at least 8 hours.   BUN <5 (L) 6 - 20 mg/dL   Creatinine, Ser 4.78 0.61 - 1.24 mg/dL   Calcium 8.7 (L) 8.9 - 10.3 mg/dL   Total Protein 7.0 6.5 - 8.1 g/dL   Albumin 3.1 (L) 3.5 - 5.0 g/dL   AST 91 (H) 15 - 41 U/L   ALT 84 (H) 0 - 44 U/L   Alkaline Phosphatase 82 38 - 126 U/L   Total Bilirubin 0.9 0.3 - 1.2 mg/dL   GFR calc non Af Amer >60 >60 mL/min   GFR calc Af Amer >60 >60 mL/min   Anion gap 8 5 - 15    Comment: Performed at [facility], [address], [place], [place] [address]  Magnesium     Status: None   Collection Time: 04/18/20  5:28 AM  Result Value Ref Range   Magnesium 2.1 1.7 - 2.4 mg/dL    Comment: Performed at [facility], [address] [place], [place] [address]  Hepatitis panel, acute     Status: Abnormal   Collection Time: 04/18/20  5:28 AM  Result Value Ref Range   Hepatitis B Surface Ag NON REACTIVE NON REACTIVE   HCV Ab Reactive (A) NON REACTIVE    Comment: (NOTE) The CDC recommends that a Reactive HCV antibody result be followed  up  with a HCV Nucleic Acid Amplification test.     Hep A IgM NON REACTIVE NON REACTIVE   Hep B C IgM NON REACTIVE NON REACTIVE    Comment: Performed at [facility], [address] [place], [place] [address]  Vitamin B12     Status: None   Collection Time: 04/18/20  5:28 AM  Result Value Ref Range   Vitamin B-12 493 180 - 914 pg/mL    Comment: (NOTE) This assay is not validated for testing neonatal or myeloproliferative syndrome specimens for Vitamin B12 levels. Performed at [facility], [address] [place], [place] [address]   Folate     Status: None   Collection Time: 04/18/20  5:28 AM  Result Value Ref Range   Folate 11.1 >5.9 ng/mL    Comment: Performed at [facility], [address], [place], [place] [address]  Iron and TIBC     Status: Abnormal   Collection Time: 04/18/20  5:28 AM  Result Value Ref Range   Iron 65 45 - 182 ug/dL   TIBC 829 562 - 130 ug/dL   Saturation Ratios 16 (L) 17.9 - 39.5 %   UIBC 331 ug/dL    Comment: Performed at [facility], [address] [place], [place] [address]  Ferritin     Status: None   Collection Time: 04/18/20  5:28 AM  Result Value Ref Range   Ferritin 76 24 - 336 ng/mL    Comment: Performed at [facility], [address] [place], [place] [address]  Reticulocytes     Status: None   Collection Time: 04/18/20  5:28 AM  Result Value Ref Range   Retic Ct Pct 1.3 0.4 - 3.1 %   RBC. 4.91 4.22 - 5.81 MIL/uL   Retic Count, Absolute 62.4 19.0 - 186.0 K/uL   Immature Retic Fract 12.4 2.3 - 15.9 %    Comment: Performed at [facility], [address], [place], [place] [address]   CT ABDOMEN PELVIS W CONTRAST  Result Date: 04/16/2020 CLINICAL DATA:  Lower abdominal pain, fever, and nausea. Abscess/infection suspected. EXAM: CT ABDOMEN AND PELVIS WITH CONTRAST TECHNIQUE: Multidetector CT imaging of the abdomen and pelvis was  performed using the standard protocol following bolus administration of intravenous contrast. CONTRAST:  OMNIPAQUE IOHEXOL 300 MG/ML  SOLN COMPARISON:  None. FINDINGS: Lower chest: No basilar lung consolidation or pleural effusion. A few small subpleural nodules in the right lower lobe and right middle lobe measuring 2-3 mm in size are likely benign in a patient of this age without a history of malignancy. Hepatobiliary: Diffusely decreased attenuation of the liver compatible with steatosis. Small stones in the gallbladder with mild gallbladder wall thickening and/or small volume pericholecystic fluid. No biliary dilatation. Pancreas: Unremarkable. Spleen: Splenomegaly with an AP diameter of 15.1 cm. Adrenals/Urinary Tract: Unremarkable adrenal glands. No evidence of renal mass, calculi, or hydronephrosis. Excreted contrast material in the bladder likely related to the MRI earlier today. Stomach/Bowel: The stomach is unremarkable. There is no evidence of bowel obstruction or inflammation. The appendix is unremarkable. Vascular/Lymphatic: No significant vascular findings are present. No enlarged abdominal or pelvic lymph nodes. Reproductive: Unremarkable prostate. Other: Trace free fluid in the pelvis no pneumoperitoneum. Musculoskeletal: No acute osseous abnormality or suspicious osseous lesion. IMPRESSION: 1. Cholelithiasis with mild gallbladder wall thickening and/or small volume pericholecystic fluid. Consider right upper quadrant ultrasound to further evaluate for possible cholecystitis. 2. Trace pelvic free fluid. 3. Hepatic steatosis. 4. Splenomegaly. Electronically Signed   By: Sebastian Ache M.D.   On: 04/16/2020 12:45   NM Hepato W/EF  Result Date: 04/18/2020 CLINICAL DATA:  Right upper quadrant pain.  Cholelithiasis. EXAM: NUCLEAR MEDICINE HEPATOBILIARY IMAGING WITH GALLBLADDER EF TECHNIQUE: Sequential images of the abdomen were obtained out to 60 minutes following intravenous  administration of  radiopharmaceutical. After oral ingestion of Ensure, gallbladder ejection fraction was determined. At 60 min, normal ejection fraction is greater than 33%. RADIOPHARMACEUTICALS:  5.3 mCi Tc-33m  Choletec IV COMPARISON:  None. FINDINGS: Prompt uptake and biliary excretion of activity by the liver is seen. Gallbladder activity is visualized, consistent with patency of cystic duct. Biliary activity passes into small bowel, consistent with patent common bile duct. Reflux of biliary activity into the stomach is noted. Calculated gallbladder ejection fraction is 69%. (Normal gallbladder ejection fraction with Ensure is greater than 33%.) IMPRESSION: Patency of cystic and common bile ducts demonstrated. Normal gallbladder ejection fraction. Bile reflux into stomach noted. Electronically Signed   By: Danae Orleans M.D.   On: 04/18/2020 11:04   US Abdomen Limited RUQ  Result Date: 04/16/2020 CLINICAL DATA:  Right upper quadrant abdominal pain. EXAM: ULTRASOUND ABDOMEN LIMITED RIGHT UPPER QUADRANT COMPARISON:  None. FINDINGS: Gallbladder: There is mild gallbladder wall thickening. Multiple gallstones, measuring up to 0.5 cm. No sonographic Murphy sign noted by the sonographer. The gallbladder is not distended. Common bile duct: Diameter: 4.6 mm, within normal limits. Liver: No focal lesion identified. Within normal limits in parenchymal echogenicity. Portal vein is patent on color Doppler imaging with normal direction of blood flow towards the liver. IMPRESSION: Mild gallbladder wall thickening with gallstones. No gallbladder distension and negative sonographic Murphy sign per the technologist. Findings are equivocal for acute cholecystitis. If there is clinical uncertainty then a HIDA scan could further evaluate. Electronically Signed   By: Feliberto Harts MD   On: 04/16/2020 14:33   . lactated ringers 100 mL/hr at 04/18/20 1125  . piperacillin-tazobactam (ZOSYN)  IV 3.375 g (04/18/20 0453)      Assessment/Plan Hx polysubstance abuse including:  -Heroin, cocaine, opioids, benzodiazepines Cocaine induced mood disorder - 03/19/20 Fever - uncertain etiology   Abdominal pain with nausea GERD Hepatitis C reactive Cholelithiasis with normal HIDA scan/EF 69%  FEN: IV fluids/NPO ID:  Lovenox Follow up:  None currently required by Surgery   Plan:  He has a hx of GERD, and abdominal pain/nausea with eating that is intermittent, and worse over the last 2 weeks.  He does have GERD sx, and Hepatitis C to help explain his clinical symptoms.  With a normal HIDA,  EF 69% it is difficult to attribute his symptoms to Biliary dyskinesia.  I would recommend treatment for his GERD and hepatitis C.  Low fat diet and see how he does. I will review with Dr. Daphine Deutscher.       Sherrie George [facility] 04/18/2020, 11:16 AM Please see Amion for pager number during day hours 7:00am-4:30pm

## 2020-04-18 NOTE — Progress Notes (Addendum)
[facility] PROGRESS NOTE   Frederick Franklin JGO:[id number] DOB: 1990/08/26 DOA: 04/16/2020  PCP: Patient, No Pcp Per  Brief History/Interval Summary: 29 year old Caucasian male with a past medical history of substance abuse, history of depression presented to the ER with complaints of abdominal pain.  He also had fever at home.  Pain was located in the right abdomen.  Patient mentioned that the pain was radiating to his back.  In the ER he underwent extensive work-up including chest x-ray which was unremarkable.  MRI of the lumbar spine revealed a bulging disc at L3-L4.  CT abdomen revealed cholelithiasis with mild gallbladder wall thickening and small pericholecystic fluid.  Hepatic steatosis and splenomegaly was also noted.  Ultrasound of the right upper quadrant also revealed mild gallbladder thickening with cholelithiasis.  HIDA scan was recommended.  Reason for Visit: Abdominal pain  Consultants: None yet  Procedures: None yet  Antibiotics: Anti-infectives (From admission, onward)   Start     Dose/Rate Route Frequency Ordered Stop   04/16/20 1815  piperacillin-tazobactam (ZOSYN) IVPB 3.375 g        3.375 g 12.5 mL/hr over 240 Minutes Intravenous Every 8 hours 04/16/20 1805        Subjective/Interval History: Patient seen at nuclear medicine.  He was undergoing HIDA scan.  Mentions that he has had some nausea but no vomiting.  Continues to have pain in the right upper abdomen at 7 out of 10 in intensity.     Assessment/Plan:  Severe sepsis, present on admission Patient presented with fever tachycardia leukocytosis lactic acidosis and possible abdominal source of infection.   Continue Zosyn for now.  See below.  Cultures negative so far.    Abdominal pain with concern for acute cholecystitis/transaminitis Patient remains symptomatic.  LFTs are abnormal.  Ultrasound did show cholelithiasis but was equivocal for cholecystitis.  HIDA scan is pending.  Continue pain medications.   LFTs are elevated but stable.  Bilirubin is normal.  Lipase noted to be normal.  Hepatitis panel is pending.    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  Hyperglycemia Noted to have glucose level of 313.  CBGs noted to be elevated.  No known history of diabetes.  HbA1c 6.3.  Tobacco abuse Nicotine patch.  Microcytic anemia/mild thrombocytopenia Hemoglobin is stable.  Platelet counts normal today.  Anemia panel reviewed.  No clear-cut deficiencies identified.  Hypokalemia Potassium normal today magnesium is 2.1.  L3-L4 disc bulging This was mild.  Noted on MRI.  No symptoms currently.   DVT Prophylaxis: Lovenox Code Status: Full code Family Communication: Discussed with the patient Disposition Plan: Hopefully return home when improved  Status is: Inpatient  Remains inpatient appropriate because:Ongoing diagnostic testing needed not appropriate for outpatient work up and IV treatments appropriate due to intensity of illness or inability to take PO   Dispo: The patient is from: Home              Anticipated d/c is to: Home              Anticipated d/c date is: 2 days              Patient currently is not medically stable to d/c.       Medications:  Scheduled: . enoxaparin (LOVENOX) injection  40 mg Subcutaneous QHS  . insulin aspart  0-15 Units Subcutaneous TID AC & HS  . nicotine  21 mg Transdermal Daily  . QUEtiapine  200 mg Oral QHS  Continuous: . lactated ringers 100 mL/hr at 04/17/20 1202  . piperacillin-tazobactam (ZOSYN)  IV 3.375 g (04/18/20 0453)   JAS:[id number], hydrOXYzine, morphine injection, ondansetron **OR** ondansetron (ZOFRAN) IV   Objective:  Vital Signs  Vitals:   04/17/20 2048 04/18/20 0024 04/18/20 0656 04/18/20 0700  BP: (!) 144/94 (!) 141/86 139/85   Pulse: 85 79 80   Resp: 17 17 17    Temp: 99 F (37.2 C) 99.2 F (37.3 C) 98.8 F (37.1 C)   TempSrc: Oral Oral Oral   SpO2:  99% 99% 99%   Weight:    95.3 kg  Height:        Intake/Output Summary (Last 24 hours) at 04/18/2020 1024 Last data filed at 04/18/2020 0357 Gross per 24 hour  Intake 2517.13 ml  Output 0 ml  Net 2517.13 ml   Filed Weights   04/16/20 0524 04/18/20 0700  Weight: 97.5 kg 95.3 kg    General appearance: Awake alert.  In no distress Resp: Clear to auscultation bilaterally.  Normal effort Cardio: S1-S2 is normal regular.  No S3-S4.  No rubs murmurs or bruit GI: Abdomen soft.  Tender in the right upper quadrant.  04/20/20 sign is equivocal.  No masses organomegaly.  No rebound rigidity or guarding.   Extremities: No edema.  Full range of motion of lower extremities. Neurologic: Alert and oriented x3.  No focal neurological deficits.     Lab Results:  Data Reviewed: I have personally reviewed following labs and imaging studies  CBC: Recent Labs  Lab 04/16/20 0409 04/17/20 0444 04/18/20 0528  WBC 11.4* 5.3 6.2  NEUTROABS 9.3* 2.1 3.4  HGB 12.4* 11.3* 12.8*  HCT 35.9* 33.5* 39.4  MCV 79.1* 79.6* 80.4  PLT 133* 116* 152    Basic Metabolic Panel: Recent Labs  Lab 04/16/20 0409 04/17/20 0444 04/18/20 0528  NA 134* 140 139  K 3.3* 3.3* 3.8  CL 105 110 106  CO2 20* 24 25  GLUCOSE 313* 117* 133*  BUN 9 9 <5*  CREATININE 0.89 0.56* 0.66  CALCIUM 8.2* 8.1* 8.7*  MG  --  1.9 2.1    GFR: Estimated Creatinine Clearance: 162 mL/min (by C-G formula based on SCr of 0.66 mg/dL).  Liver Function Tests: Recent Labs  Lab 04/16/20 0409 04/17/20 0444 04/18/20 0528  AST 94* 105* 91*  ALT 82* 81* 84*  ALKPHOS 98 78 82  BILITOT 1.0 0.4 0.9  PROT 6.9 6.0* 7.0  ALBUMIN 3.1* 2.8* 3.1*     Coagulation Profile: Recent Labs  Lab 04/16/20 0409  INR 1.3*     HbA1C: Recent Labs    04/17/20 0444  HGBA1C 6.3*    CBG: Recent Labs  Lab 04/16/20 2214 04/17/20 0755 04/17/20 1218 04/17/20 1725 04/17/20 2051  GLUCAP 108* 119* 208* 251* 190*      Recent Results  (from the past 240 hour(s))  Culture, blood (Routine x 2)     Status: None (Preliminary result)   Collection Time: 04/16/20  4:09 AM   Specimen: BLOOD  Result Value Ref Range Status   Specimen Description   Final    BLOOD LEFT ANTECUBITAL Performed at [facility], [address] [place], [place]    Special Requests   Final    BOTTLES DRAWN AEROBIC AND ANAEROBIC Blood Culture adequate volume Performed at [facility], [address], [place], [place]    Culture   Final    NO GROWTH < 24 HOURS Performed at  [facility], [address] [place], [place] [address]    Report Status PENDING  Incomplete  Respiratory Panel by RT PCR (Flu A&B, Covid) - Nasopharyngeal Swab     Status: None   Collection Time: 04/16/20  4:11 AM   Specimen: Nasopharyngeal Swab  Result Value Ref Range Status   SARS Coronavirus 2 by RT PCR NEGATIVE NEGATIVE Final    Comment: (NOTE) SARS-CoV-2 target nucleic acids are NOT DETECTED.  The SARS-CoV-2 RNA is generally detectable in upper respiratoy specimens during the acute phase of infection. The lowest concentration of SARS-CoV-2 viral copies this assay can detect is 131 copies/mL. A negative result does not preclude SARS-Cov-2 infection and should not be used as the sole basis for treatment or other patient management decisions. A negative result may occur with  improper specimen collection/handling, submission of specimen other than nasopharyngeal swab, presence of viral mutation(s) within the areas targeted by this assay, and inadequate number of viral copies (<131 copies/mL). A negative result must be combined with clinical observations, patient history, and epidemiological information. The expected result is Negative.  Fact Sheet for Patients:  https://www.moore.com/https://www.fda.gov/media/142436/download  Fact Sheet for Healthcare Providers:  https://www.young.biz/https://www.fda.gov/media/142435/download  This test is no t  yet approved or cleared by the [place] FDA and  has been authorized for detection and/or diagnosis of SARS-CoV-2 by FDA under an Emergency Use Authorization (EUA). This EUA will remain  in effect (meaning this test can be used) for the duration of the COVID-19 declaration under Section 564(b)(1) of the Act, 21 U.S.C. section 360bbb-3(b)(1), unless the authorization is terminated or revoked sooner.     Influenza A by PCR NEGATIVE NEGATIVE Final   Influenza B by PCR NEGATIVE NEGATIVE Final    Comment: (NOTE) The Xpert Xpress SARS-CoV-2/FLU/RSV assay is intended as an aid in  the diagnosis of influenza from Nasopharyngeal swab specimens and  should not be used as a sole basis for treatment. Nasal washings and  aspirates are unacceptable for Xpert Xpress SARS-CoV-2/FLU/RSV  testing.  Fact Sheet for Patients: https://www.moore.com/https://www.fda.gov/media/142436/download  Fact Sheet for Healthcare Providers: https://www.young.biz/https://www.fda.gov/media/142435/download  This test is not yet approved or cleared by the [place] FDA and  has been authorized for detection and/or diagnosis of SARS-CoV-2 by  FDA under an Emergency Use Authorization (EUA). This EUA will remain  in effect (meaning this test can be used) for the duration of the  Covid-19 declaration under Section 564(b)(1) of the Act, 21  U.S.C. section 360bbb-3(b)(1), unless the authorization is  terminated or revoked. Performed at [facility], [address] [place], [place] [address]   Culture, blood (Routine x 2)     Status: None (Preliminary result)   Collection Time: 04/16/20  4:11 PM   Specimen: BLOOD RIGHT HAND  Result Value Ref Range Status   Specimen Description   Final    BLOOD RIGHT HAND Performed at [facility], [address] [place], [place] [address]    Special Requests   Final    BOTTLES DRAWN AEROBIC AND ANAEROBIC Blood Culture adequate volume Performed at [facility],  [address] [place], [place] [address]    Culture   Final    NO GROWTH < 12 HOURS Performed at [facility], [address] [place], [place] [address]    Report Status PENDING  Incomplete      Radiology Studies: CT ABDOMEN PELVIS W CONTRAST  Result Date: 04/16/2020 CLINICAL DATA:  Lower abdominal pain,  fever, and nausea. Abscess/infection suspected. EXAM: CT ABDOMEN AND PELVIS WITH CONTRAST TECHNIQUE: Multidetector CT imaging of the abdomen and pelvis was performed using the standard protocol following bolus administration of intravenous contrast. CONTRAST:  OMNIPAQUE IOHEXOL 300 MG/ML  SOLN COMPARISON:  None. FINDINGS: Lower chest: No basilar lung consolidation or pleural effusion. A few small subpleural nodules in the right lower lobe and right middle lobe measuring 2-3 mm in size are likely benign in a patient of this age without a history of malignancy. Hepatobiliary: Diffusely decreased attenuation of the liver compatible with steatosis. Small stones in the gallbladder with mild gallbladder wall thickening and/or small volume pericholecystic fluid. No biliary dilatation. Pancreas: Unremarkable. Spleen: Splenomegaly with an AP diameter of 15.1 cm. Adrenals/Urinary Tract: Unremarkable adrenal glands. No evidence of renal mass, calculi, or hydronephrosis. Excreted contrast material in the bladder likely related to the MRI earlier today. Stomach/Bowel: The stomach is unremarkable. There is no evidence of bowel obstruction or inflammation. The appendix is unremarkable. Vascular/Lymphatic: No significant vascular findings are present. No enlarged abdominal or pelvic lymph nodes. Reproductive: Unremarkable prostate. Other: Trace free fluid in the pelvis no pneumoperitoneum. Musculoskeletal: No acute osseous abnormality or suspicious osseous lesion. IMPRESSION: 1. Cholelithiasis with mild gallbladder wall thickening and/or small volume pericholecystic fluid. Consider right upper quadrant  ultrasound to further evaluate for possible cholecystitis. 2. Trace pelvic free fluid. 3. Hepatic steatosis. 4. Splenomegaly. Electronically Signed   By: Sebastian Ache M.D.   On: 04/16/2020 12:45   US Abdomen Limited RUQ  Result Date: 04/16/2020 CLINICAL DATA:  Right upper quadrant abdominal pain. EXAM: ULTRASOUND ABDOMEN LIMITED RIGHT UPPER QUADRANT COMPARISON:  None. FINDINGS: Gallbladder: There is mild gallbladder wall thickening. Multiple gallstones, measuring up to 0.5 cm. No sonographic Murphy sign noted by the sonographer. The gallbladder is not distended. Common bile duct: Diameter: 4.6 mm, within normal limits. Liver: No focal lesion identified. Within normal limits in parenchymal echogenicity. Portal vein is patent on color Doppler imaging with normal direction of blood flow towards the liver. IMPRESSION: Mild gallbladder wall thickening with gallstones. No gallbladder distension and negative sonographic Murphy sign per the technologist. Findings are equivocal for acute cholecystitis. If there is clinical uncertainty then a HIDA scan could further evaluate. Electronically Signed   By: Feliberto Harts MD   On: 04/16/2020 14:33       LOS: 2 days   Osvaldo Shipper  [facility] Pager on [email or web address]  04/18/2020, 10:24 AM

## 2020-04-18 NOTE — Progress Notes (Signed)
MD paged for prn anxiety med. See mar

## 2020-04-18 NOTE — Progress Notes (Signed)
Initial Nutrition Assessment  INTERVENTION:   Once diet advanced: -Boost Breeze po TID, each supplement provides 250 kcal and 9 grams of protein -Multivitamin with minerals daily  NUTRITION DIAGNOSIS:   Increased nutrient needs related to  (substance abuse) as evidenced by estimated needs.  GOAL:   Patient will meet greater than or equal to 90% of their needs  MONITOR:   Diet advancement, Labs, Weight trends, I & O's  REASON FOR ASSESSMENT:   Malnutrition Screening Tool    ASSESSMENT:   29 year old Caucasian male with a past medical history of substance abuse, history of depression presented to the ER with complaints of abdominal pain.  Patient in nuclear medicine earlier for HIDA scan and then was having patient care at time of RD visit. Will speak with pt at follow-up.  Patient currently NPO.  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 Has had 2 weeks of abdominal pain following consumption of higher fat foods. Pt also having symptoms of reflux as well. Recommend Boost Breeze once diet is advanced for additional kcals and protein.  Pt reported 20 lbs of weight loss over the past 3-4 weeks. Per weight records, no recent weights to verify weight loss with. This weight loss would be significant for time frame.  Medications: Lactated ringers Labs reviewed: CBGs: 118-190  NUTRITION - FOCUSED PHYSICAL EXAM:  Unable to complete at this time  Diet Order:   Diet Order            Diet NPO time specified  Diet effective midnight                 EDUCATION NEEDS:   Not appropriate for education at this time  Skin:  Skin Assessment: Reviewed RN Assessment  Last BM:  9/28  Height:   Ht Readings from Last 1 Encounters:  04/16/20 5\' 11"  (1.803 m)    Weight:   Wt Readings from Last 1 Encounters:  04/18/20 95.3 kg   BMI:  Body mass index is 29.29 kg/m.  Estimated Nutritional Needs:   Kcal:  2100-2300  Protein:   95-105g  Fluid:  2.1L/day  04/20/20, MS, RD, LDN Inpatient Clinical Dietitian Contact information available via Amion

## 2020-04-18 NOTE — Progress Notes (Signed)
Patient is ambulating in room to the rest room and talking on the phone  While also requesting his morphine reporting that he is at a pain level of 8 to 9. Patient also takes a shower while listening to music and still reports a pain level of 8. Provider notified.

## 2020-04-19 LAB — CBC WITH DIFFERENTIAL/PLATELET
Abs Immature Granulocytes: 0.02 10*3/uL (ref 0.00–0.07)
Basophils Absolute: 0 10*3/uL (ref 0.0–0.1)
Basophils Relative: 0 %
Eosinophils Absolute: 0.2 10*3/uL (ref 0.0–0.5)
Eosinophils Relative: 2 %
HCT: 41.1 % (ref 39.0–52.0)
Hemoglobin: 13.6 g/dL (ref 13.0–17.0)
Immature Granulocytes: 0 %
Lymphocytes Relative: 37 %
Lymphs Abs: 2.5 10*3/uL (ref 0.7–4.0)
MCH: 26.6 pg (ref 26.0–34.0)
MCHC: 33.1 g/dL (ref 30.0–36.0)
MCV: 80.3 fL (ref 80.0–100.0)
Monocytes Absolute: 0.4 10*3/uL (ref 0.1–1.0)
Monocytes Relative: 6 %
Neutro Abs: 3.7 10*3/uL (ref 1.7–7.7)
Neutrophils Relative %: 55 %
Platelets: 161 10*3/uL (ref 150–400)
RBC: 5.12 MIL/uL (ref 4.22–5.81)
RDW: 14.1 % (ref 11.5–15.5)
WBC: 6.7 10*3/uL (ref 4.0–10.5)
nRBC: 0 % (ref 0.0–0.2)

## 2020-04-19 LAB — COMPREHENSIVE METABOLIC PANEL
ALT: 77 U/L — ABNORMAL HIGH (ref 0–44)
AST: 74 U/L — ABNORMAL HIGH (ref 15–41)
Albumin: 3.2 g/dL — ABNORMAL LOW (ref 3.5–5.0)
Alkaline Phosphatase: 88 U/L (ref 38–126)
Anion gap: 10 (ref 5–15)
BUN: 9 mg/dL (ref 6–20)
CO2: 24 mmol/L (ref 22–32)
Calcium: 9.2 mg/dL (ref 8.9–10.3)
Chloride: 106 mmol/L (ref 98–111)
Creatinine, Ser: 0.8 mg/dL (ref 0.61–1.24)
GFR calc Af Amer: >60 mL/min (ref >60)
GFR calc non Af Amer: >60 mL/min (ref >60)
Glucose, Bld: 143 mg/dL — ABNORMAL HIGH (ref 70–99)
Potassium: 4.5 mmol/L (ref 3.5–5.1)
Sodium: 140 mmol/L (ref 135–145)
Total Bilirubin: 0.5 mg/dL (ref 0.3–1.2)
Total Protein: 7.2 g/dL (ref 6.5–8.1)

## 2020-04-19 LAB — GLUCOSE, CAPILLARY: Glucose-Capillary: 149 mg/dL — ABNORMAL HIGH (ref 70–99)

## 2020-04-19 LAB — MAGNESIUM: Magnesium: 2.4 mg/dL (ref 1.7–2.4)

## 2020-04-19 MED ORDER — AMOXICILLIN-POT CLAVULANATE 875-125 MG PO TABS: 1.0000 | ORAL_TABLET | Freq: Two times a day (BID) | ORAL | 0 refills | Status: AC

## 2020-04-19 MED ORDER — FAMOTIDINE 20 MG PO TABS
20.0000 mg | ORAL_TABLET | Freq: Every day | ORAL | 1 refills | Status: DC
Start: 2020-04-19 — End: 2022-12-10

## 2020-04-19 MED ORDER — NICOTINE 21 MG/24HR TD PT24: 21.0000 mg | MEDICATED_PATCH | Freq: Every day | TRANSDERMAL | 0 refills | Status: AC

## 2020-04-19 MED ORDER — QUETIAPINE FUMARATE 200 MG PO TABS: 200.0000 mg | ORAL_TABLET | Freq: Every day | ORAL | 1 refills | Status: AC

## 2020-04-19 NOTE — Discharge Summary (Signed)
Physician Discharge Summary  Patient ID: Frederick Franklin MRN: [id number] DOB/AGE: 12-29-1990 29 y.o.  Admit date: 04/16/2020 Discharge date: 04/19/2020  Admission Diagnoses:  Discharge Diagnoses:  Principal Problem:   Severe sepsis with acute organ dysfunction (HCC) Active Problems:   Severe recurrent major depression without psychotic features (HCC)   Hyperglycemia   Tobacco use   Microcytic anemia   Hypokalemia   Discharged Condition: stable  Hospital Course: 29 year old Caucasian male with a past medical history of substance abuse, history of depression presented to the ER with complaints of abdominal pain.  He also had fever at home.  Pain was located in the right abdomen.  Patient mentioned that the pain was radiating to his back.  In the ER he underwent extensive work-up including chest x-ray which was unremarkable.  MRI of the lumbar spine revealed a bulging disc at L3-L4.  CT abdomen revealed cholelithiasis with mild gallbladder wall thickening and small pericholecystic fluid.  Hepatic steatosis and splenomegaly was also noted.  Ultrasound of the right upper quadr ant also revealed mild gallbladder thickening with cholelithiasis.  HIDA scan was recommended.  Severe sepsis, present on admission Patient presented with fever tachycardia leukocytosis lactic acidosis and possible abdominal source of infection.  Work-up is in progress as discussed below.  Patient started on Zosyn which will be continued.  Follow-up on cultures.  Abdominal pain with concern for acute cholecystitis/transaminitis Patient currently on Zosyn.  Ultrasound was equivocal.  HIDA scan is pending.  Pain medications as needed.  AST ALT noted to be elevated.  Alkaline phosphatase and bilirubin are normal.  Check lipase.  Check hepatitis panel.  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  Hyperglycemia Noted to have glucose level of 313.  CBGs noted to be elevated.   No known history of diabetes.  HbA1c 6.3.  Tobacco abuse Nicotine patch.  Microcytic anemia/mild thrombocytopenia Check anemia panel.  Monitor platelet counts closely.  Hypokalemia Replete.  Magnesium 1.9.  L3-L4 disc bulging This was mild.  Noted on MRI.  No symptoms currently.  Significant Diagnostic Studies:    Discharge Exam: Blood pressure 137/89, pulse 66, temperature 97.7 F (36.5 C), temperature source Oral, resp. rate 17, height 5\' 11"  (1.803 m), weight 95.3 kg, SpO2 97 %.  Disposition: Discharge disposition: 01-Home or Self Care   Discharge Instructions    Diet - low sodium heart healthy   Complete by: As directed    Diet Carb Modified   Complete by: As directed    Increase activity slowly   Complete by: As directed      Allergies as of 04/19/2020   No Known Allergies     Medication List    STOP taking these medications   acetaminophen 325 MG tablet Commonly known as: TYLENOL     TAKE these medications   amoxicillin-clavulanate 875-125 MG tablet Commonly known as: Augmentin Take 1 tablet by mouth 2 (two) times daily for 7 days.   famotidine 20 MG tablet Commonly known as: PEPCID Take 1 tablet (20 mg total) by mouth at bedtime.   nicotine 21 mg/24hr patch Commonly known as: NICODERM CQ - dosed in mg/24 hours Place 1 patch (21 mg total) onto the skin daily. Start taking on: April 20, 2020   QUEtiapine 200 MG tablet Commonly known as: SEROQUEL Take 1 tablet (200 mg total) by mouth at bedtime. What changed: additional instructions       Follow-up Information    [facility] April 22, 2020 Follow up.  Specialty: General Surgery Why: only as needed if gallstones are symptomatic Contact information: [address] [place] [address] [phone]               Signed: Barnetta Chapel 04/19/2020, 11:10 AM

## 2020-04-19 NOTE — Progress Notes (Signed)
Patient discharged to home via transportation services. Verbalized understanding of all instructions. All belongings w/ patient. Declined assistance w/ scheduling appointment w/ PCP.

## 2020-04-19 NOTE — TOC Transition Note (Signed)
Transition of Care [facility]) - CM/SW Discharge Note   Patient Details  Name: Frederick Franklin MRN: [id number] Date of Birth: June 03, 1991  [facility] CM/SW Contact:  Bartholome Bill, RN Phone Number: 04/19/2020, 11:46 AM   Clinical Narrative:     Pt provided with [employer] coupons for prescriptions.

## 2020-04-21 LAB — CULTURE, BLOOD (ROUTINE X 2)
Culture: NO GROWTH
Culture: NO GROWTH
Special Requests: ADEQUATE
Special Requests: ADEQUATE

## 2021-03-23 ENCOUNTER — Emergency Department (HOSPITAL_COMMUNITY): Payer: Self-pay

## 2021-03-23 ENCOUNTER — Encounter (HOSPITAL_COMMUNITY): Payer: Self-pay | Admitting: Emergency Medicine

## 2021-03-23 ENCOUNTER — Emergency Department (HOSPITAL_COMMUNITY)
Admission: EM | Admit: 2021-03-23 | Discharge: 2021-03-23 | Disposition: A | Discharge status: Home / Self Care | Payer: Self-pay | Attending: Student | Admitting: Student

## 2021-03-23 DIAGNOSIS — M25521 Pain in right elbow: Secondary | ICD-10-CM | POA: Insufficient documentation

## 2021-03-23 DIAGNOSIS — Y92410 Unspecified street and highway as the place of occurrence of the external cause: Secondary | ICD-10-CM | POA: Insufficient documentation

## 2021-03-23 DIAGNOSIS — Z87891 Personal history of nicotine dependence: Secondary | ICD-10-CM | POA: Insufficient documentation

## 2021-03-23 DIAGNOSIS — M79601 Pain in right arm: Secondary | ICD-10-CM | POA: Insufficient documentation

## 2021-03-23 DIAGNOSIS — M79641 Pain in right hand: Secondary | ICD-10-CM | POA: Insufficient documentation

## 2021-03-23 NOTE — ED Notes (Signed)
Discharged by PA at triage. 
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
## 2021-07-28 IMAGING — CT CT HEAD W/O CM
1 series · 16 of 30 positions shown, 20 images · non-contrast
Comparison: None.

CLINICAL DATA: Trauma

EXAM:
CT HEAD WITHOUT CONTRAST
TECHNIQUE: Contiguous axial images were obtained from the base of the skull
through the vertex without intravenous contrast.

[Series 2: head wo · axial · 0.42mm/px · z∈[+1333,+1488]mm · 16 of 35 slices shown, 20 images]
[im 2/35  brain]
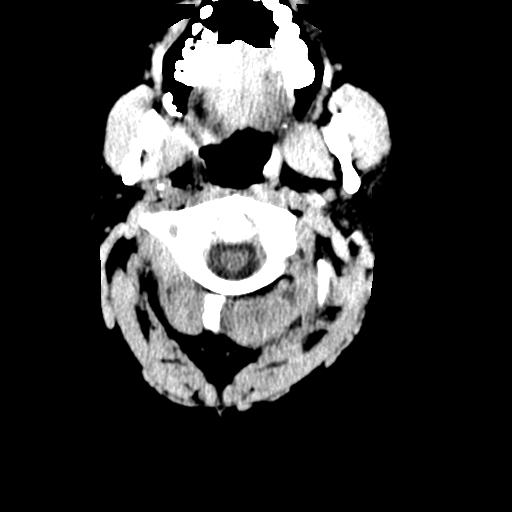
[im 2/35  bone]
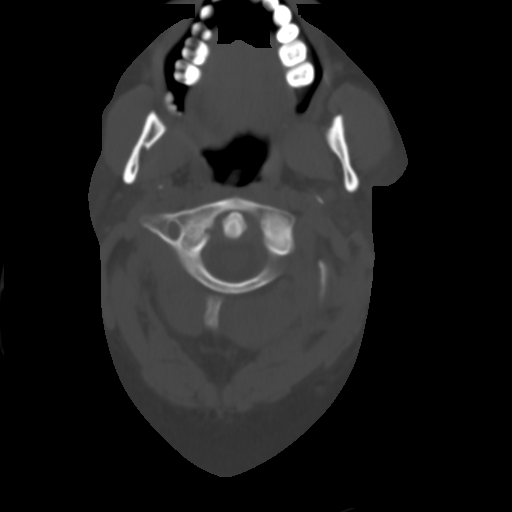
[im 4/35  brain]
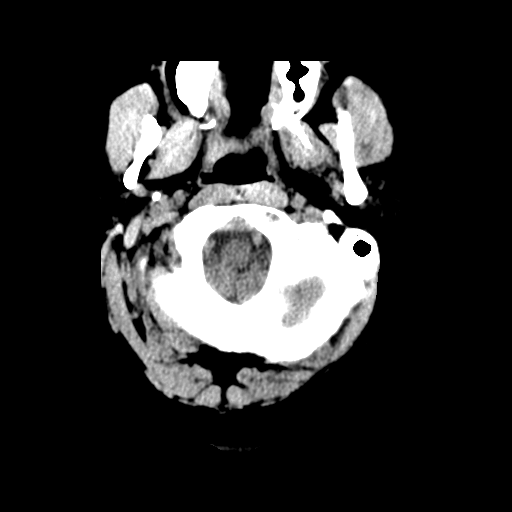
[im 6/35  brain]
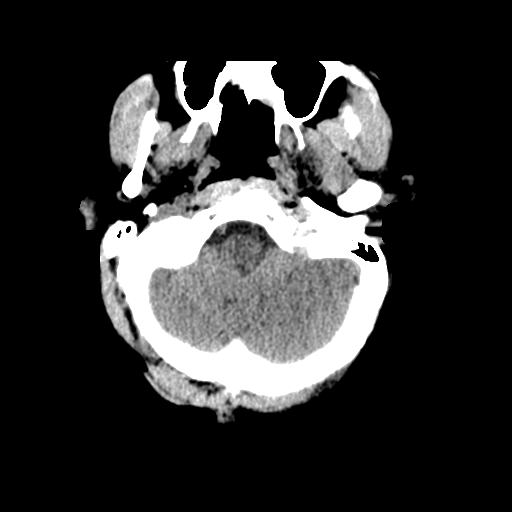
[im 9/35  brain]
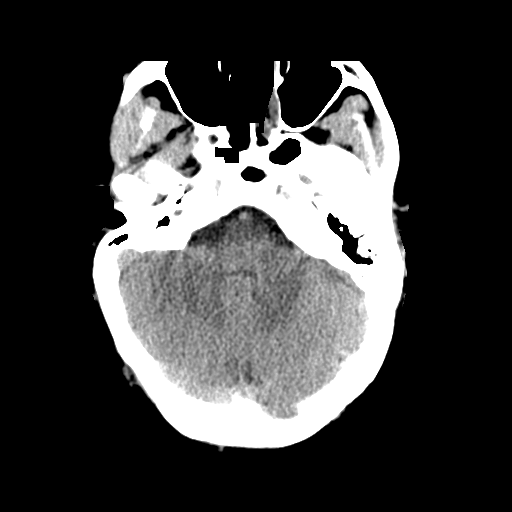
[im 10/35  brain]
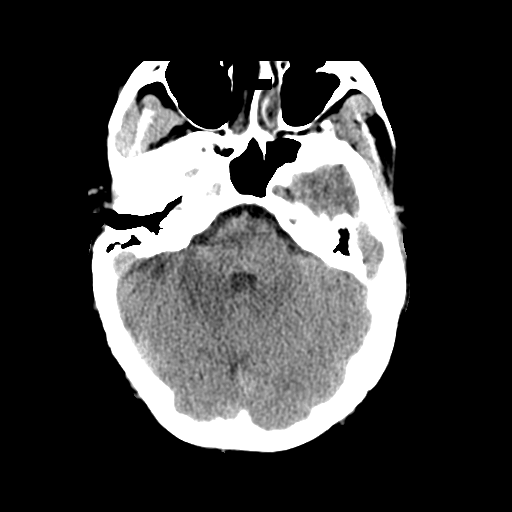
[im 10/35  bone]
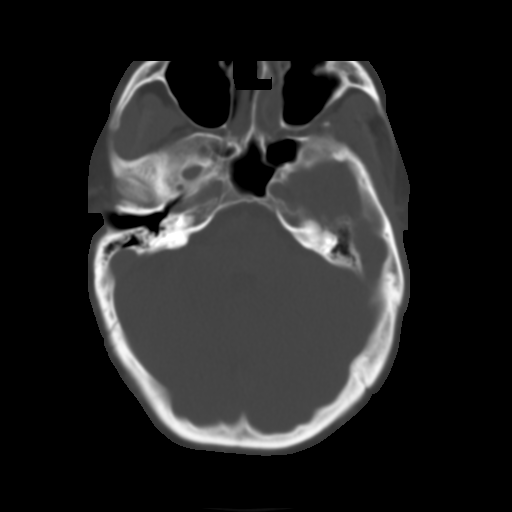
[im 12/35  brain]
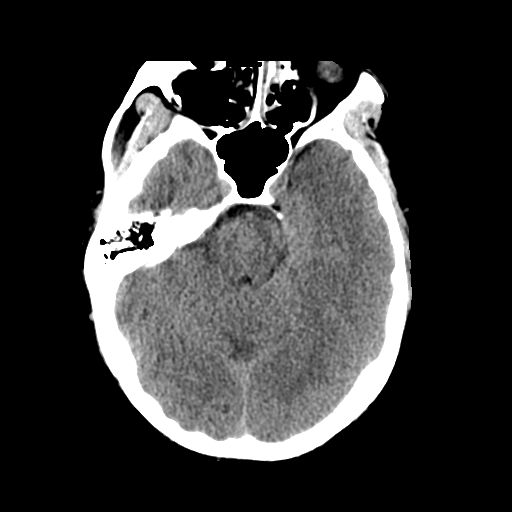
[im 15/35  brain]
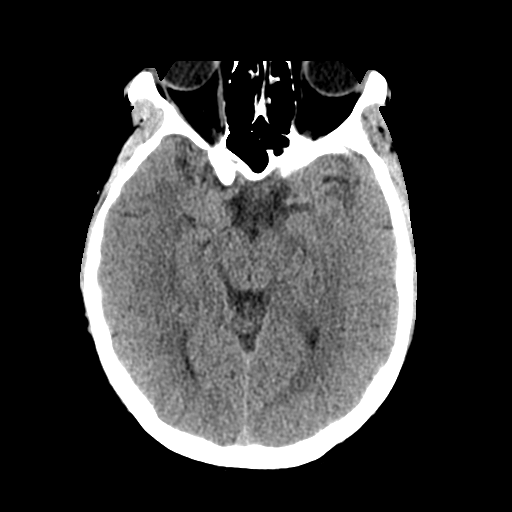
[im 17/35  brain]
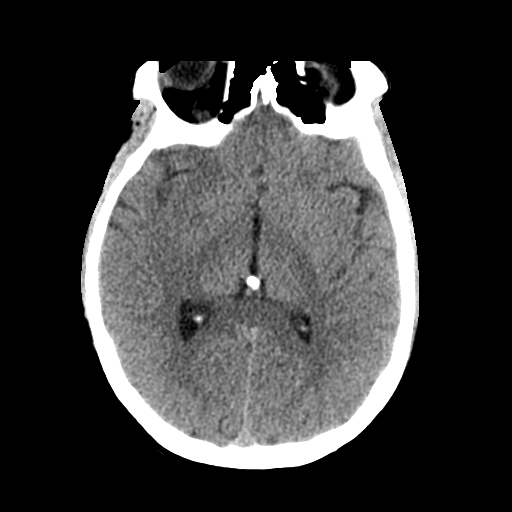
[im 18/35  brain]
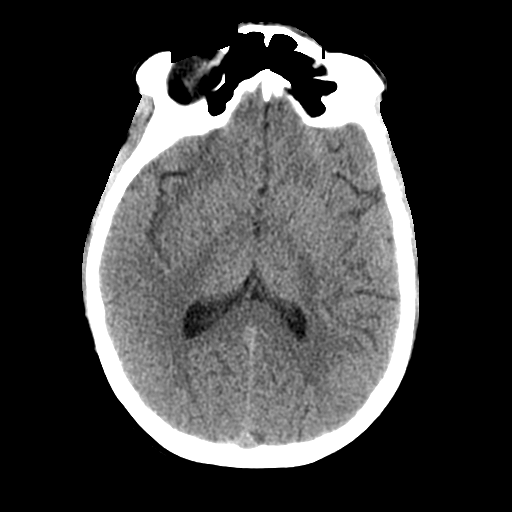
[im 18/35  bone]
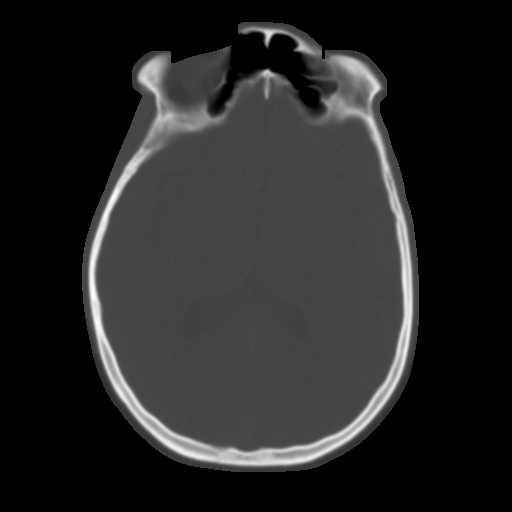
[im 20/35  brain]
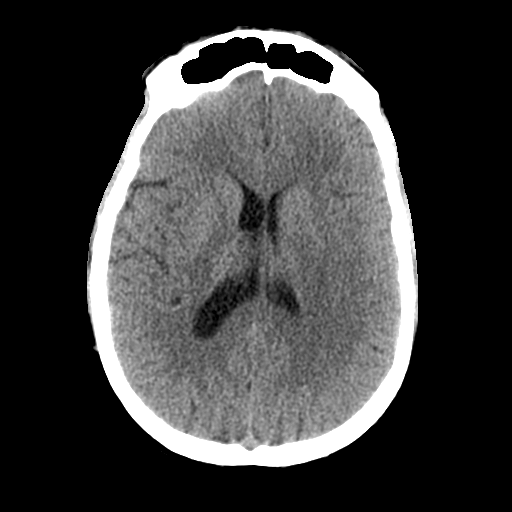
[im 23/35  brain]
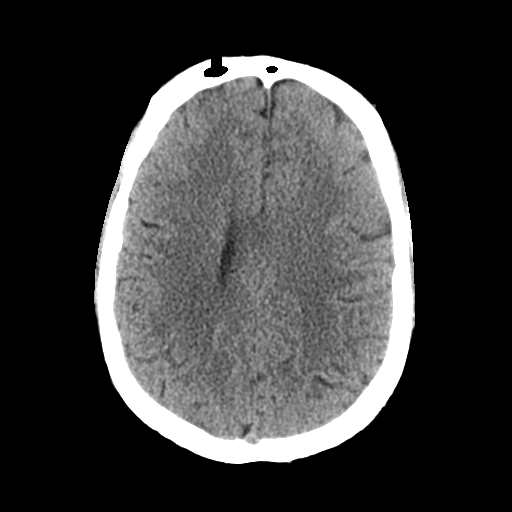
[im 25/35  brain]
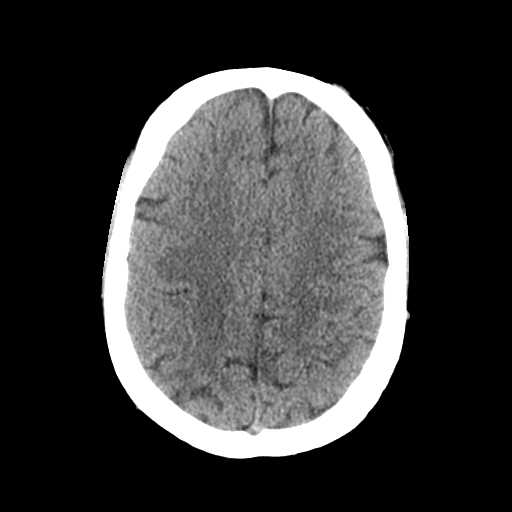
[im 26/35  brain]
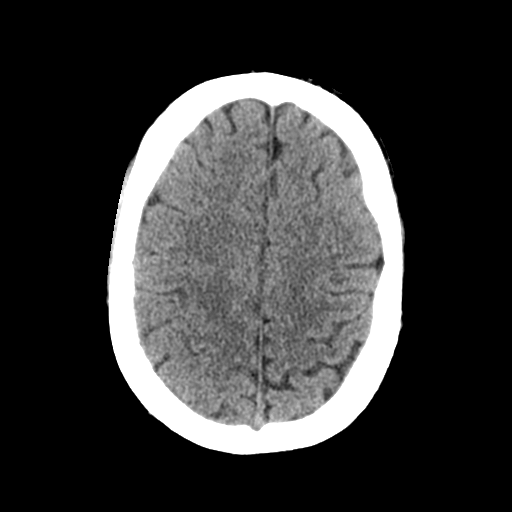
[im 26/35  bone]
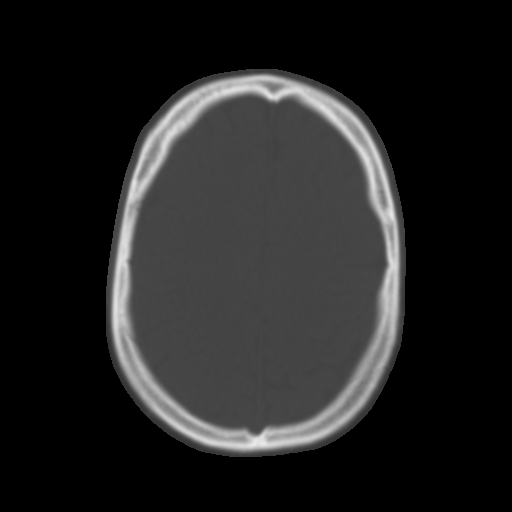
[im 29/35  brain]
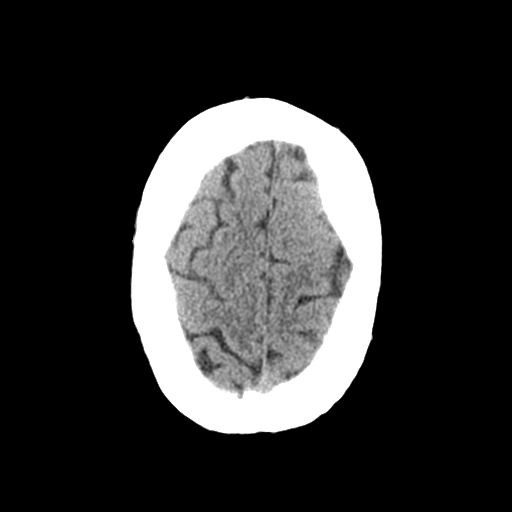
[im 31/35  brain]
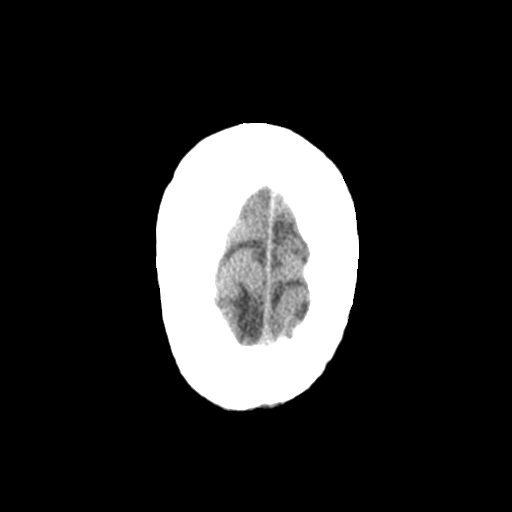
[im 33/35  brain]
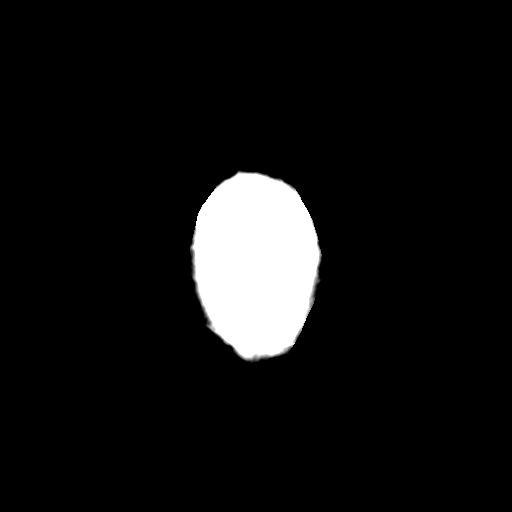

[16 of 30 positions shown; findings below may reference images not displayed]

FINDINGS: Brain: There is no acute intracranial hemorrhage, mass effect, or
edema. Gray-white differentiation is preserved. There is no
extra-axial fluid collection. Ventricles and sulci are within normal
limits in size and configuration.

Vascular: No hyperdense vessel or unexpected calcification.

Skull: Calvarium is unremarkable.

Sinuses/Orbits: Mild mucosal thickening.

Other: None.
IMPRESSION: No evidence of acute intracranial injury.

## 2021-08-30 IMAGING — CR DG CHEST 2V
2 series · 2 of 2 positions shown · non-contrast
Comparison: 12/18/2018

CLINICAL DATA: Shortness of breath, fever, and weakness for a few
days

EXAM:
CHEST - 2 VIEW

[w chest pa (1 of 2)]
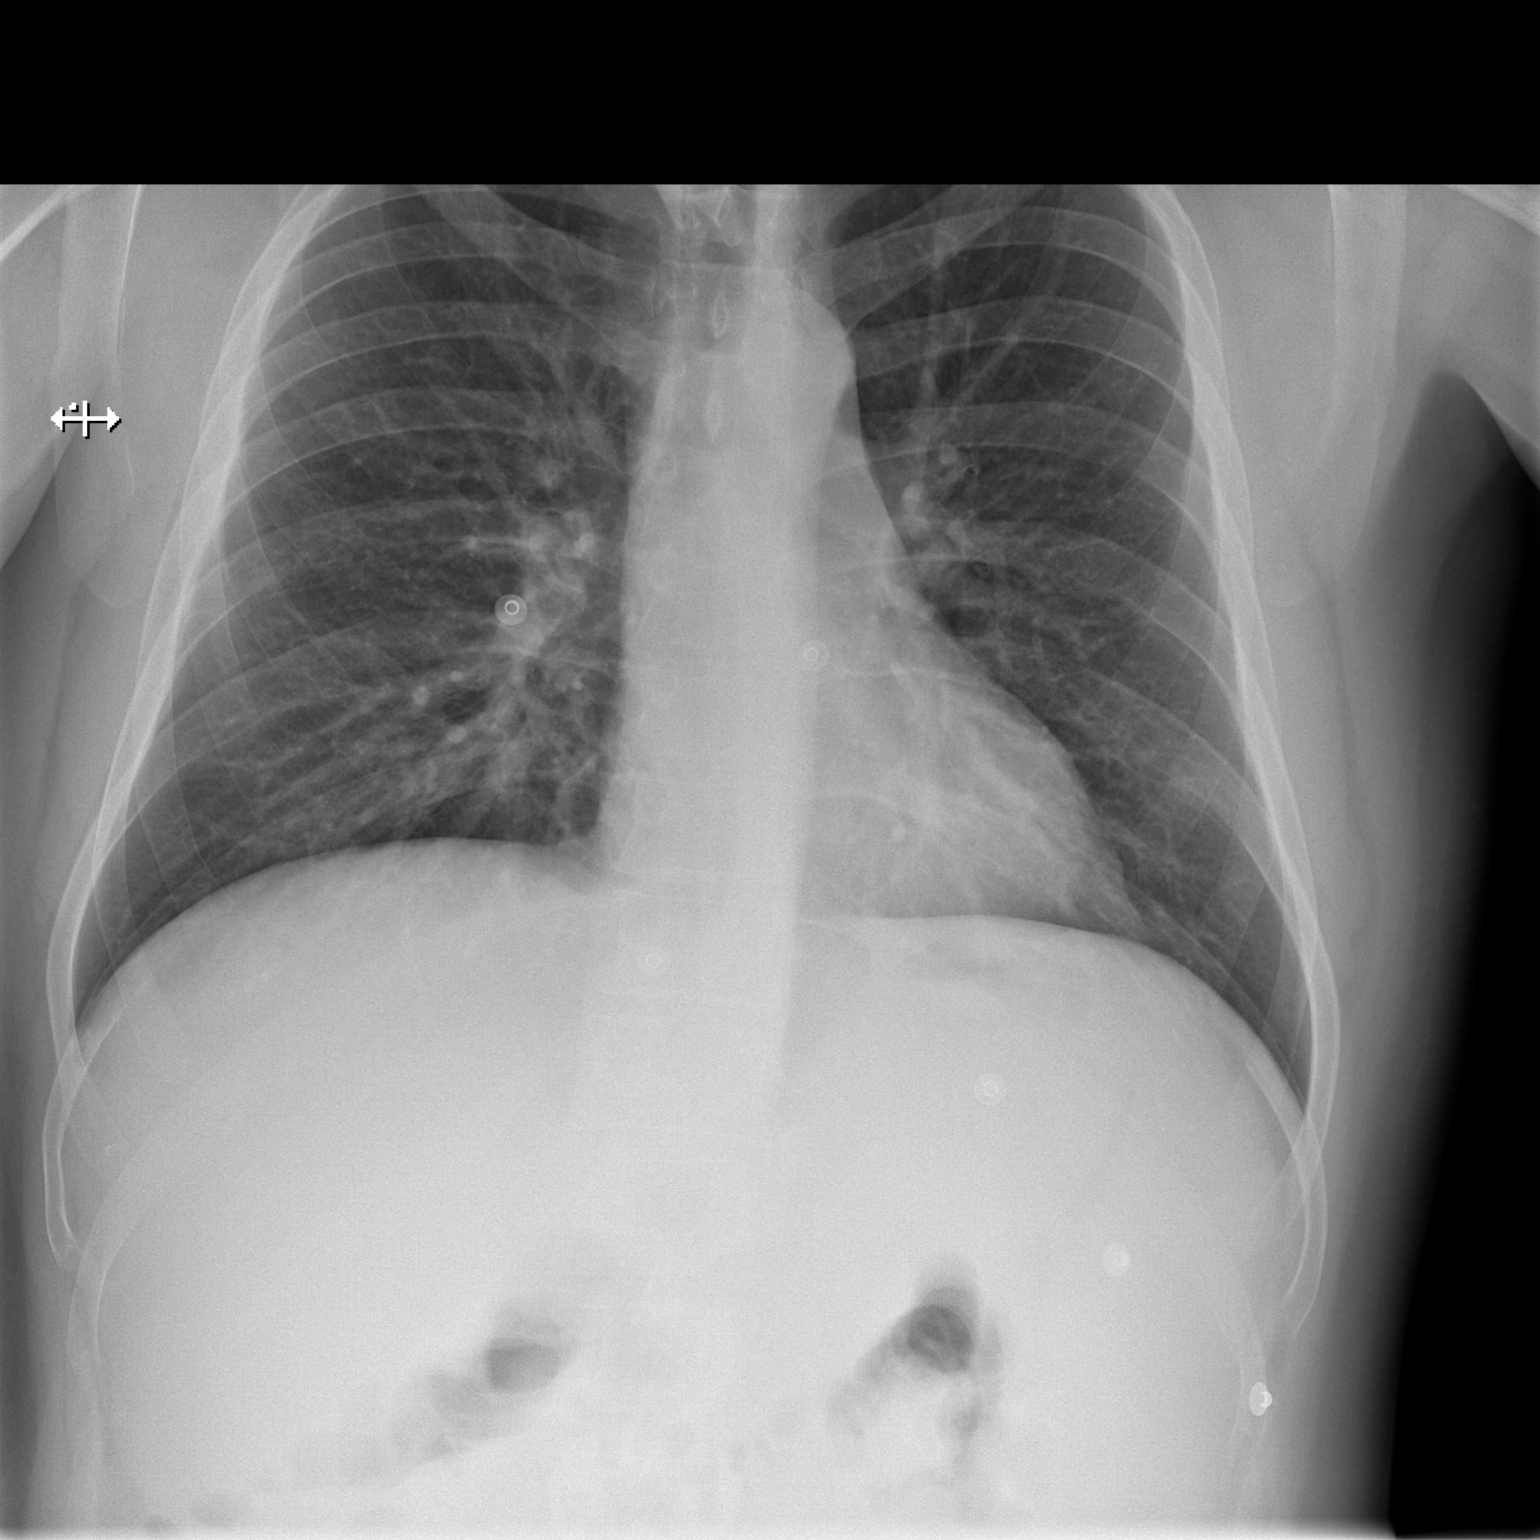

[w chest pa (2 of 2)]
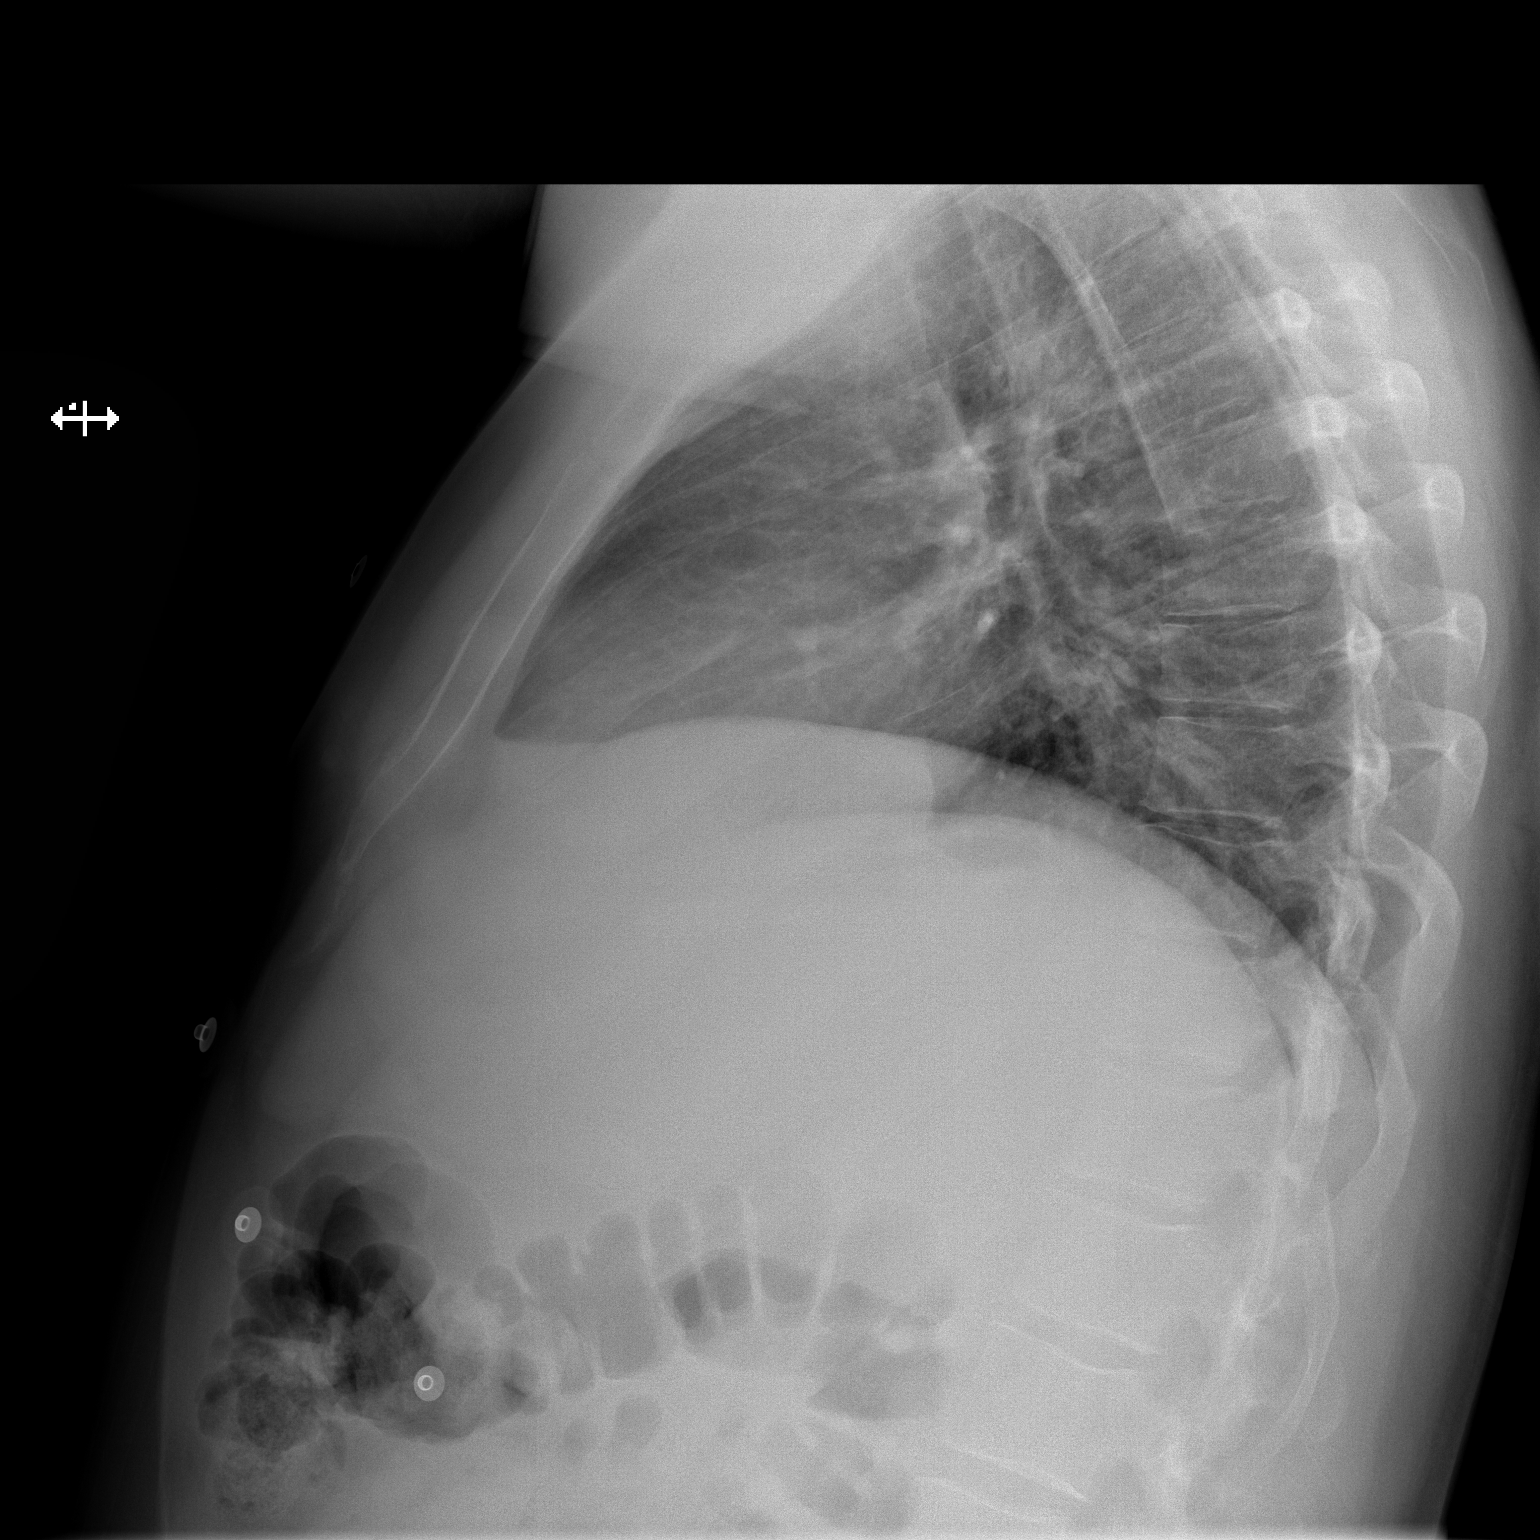

[2 of 2 positions shown; findings below may reference images not displayed]

FINDINGS: The heart size and mediastinal contours are within normal limits.
Both lungs are clear. The visualized skeletal structures are
unremarkable.
IMPRESSION: No active cardiopulmonary disease.

## 2022-07-31 LAB — LAB REPORT - SCANNED
A1c: 5.1
EGFR: 113
HM Hepatitis Screen: POSITIVE

## 2022-09-01 ENCOUNTER — Other Ambulatory Visit (HOSPITAL_COMMUNITY): Payer: Self-pay

## 2022-09-01 ENCOUNTER — Telehealth: Payer: Self-pay

## 2022-09-01 NOTE — Telephone Encounter (Signed)
[facility] Patient Teacher, English as a foreign language completed.    The patient is insured through [employer].  Patient will have to try and fail Epclusa/ Harvoni first. Medication will need a PA.   Mavyret not on formulary   We will continue to follow to see if copay assistance is needed.  Ileene Patrick, [facility] Patient [facility] for Infectious Disease Phone: [phone] Fax:  [phone]

## 2022-09-02 ENCOUNTER — Ambulatory Visit: Payer: Self-pay | Admitting: Internal Medicine

## 2022-09-02 NOTE — Progress Notes (Deleted)
        [name] for Infectious Disease  Reason for Consult: Chronic hepatitis C  Referring Provider: Elbert Ewings   HPI:    Frederick Franklin is a 32 y.o. male who presents for initial evaluation and management of chronic hepatitis C.      Patient has a history of substance use.  He was referred from [facility] with lab work from January 2024 showing elevated LFTS (AST 54, ALT 58), T bili 0.8, HCV Ab reactive, HCV RNA 4,290,000. He was referred to our clinic for further treatment of his HCV.  He has previously tested positive for HCV in Sept 2021 but is treatment naive.  He reports ***.     Patient's Medications  New Prescriptions   No medications on file  Previous Medications   FAMOTIDINE (PEPCID) 20 MG TABLET    Take 1 tablet (20 mg total) by mouth at bedtime.   NICOTINE (NICODERM CQ - DOSED IN MG/24 HOURS) 21 MG/24HR PATCH    Place 1 patch (21 mg total) onto the skin daily.   QUETIAPINE (SEROQUEL) 200 MG TABLET    Take 1 tablet (200 mg total) by mouth at bedtime.  Modified Medications   No medications on file  Discontinued Medications   No medications on file      Past Medical History:  Diagnosis Date   Depression     Social History   Tobacco Use   Smoking status: Former   Smokeless tobacco: Never  Scientific laboratory technician Use: Every day  Substance Use Topics   Alcohol use: No   Drug use: Never    Family History  Problem Relation Age of Onset   Diabetes Paternal Grandmother     No Known Allergies  ROS    OBJECTIVE:    There were no vitals filed for this visit.   There is no height or weight on file to calculate BMI.  Physical Exam   Labs and Microbiology: ***  Genotype: No results found for: "HCVGENOTYPE" HCV viral load: No results found for: "HCVQUANT" Lab Results  Component Value Date   WBC 6.7 04/19/2020   HGB 13.6 04/19/2020   HCT 41.1 04/19/2020   MCV 80.3 04/19/2020   PLT 161 04/19/2020    Lab Results  Component Value Date    CREATININE 0.80 04/19/2020   BUN 9 04/19/2020   NA 140 04/19/2020   K 4.5 04/19/2020   CL 106 04/19/2020   CO2 24 04/19/2020    Lab Results  Component Value Date   ALT 77 (H) 04/19/2020   AST 74 (H) 04/19/2020   ALKPHOS 88 04/19/2020     Lab Results  Component Value Date   INR 1.3 (H) 04/16/2020   BILITOT 0.5 04/19/2020   ALBUMIN 3.2 (L) 04/19/2020      ASSESSMENT & PLAN:    No problem-specific Assessment & Plan notes found for this encounter.   No orders of the defined types were placed in this encounter.    There are no diagnoses linked to this encounter.   ***  Raynelle Highland for Infectious Disease [facility] 09/02/2022, 8:50 AM

## 2022-09-22 ENCOUNTER — Ambulatory Visit: Payer: Self-pay | Admitting: Infectious Diseases

## 2022-09-22 NOTE — Progress Notes (Deleted)
[facility] for Infectious Diseases                                      [address], [place], [place], [address]                                               Phn. [phone]; Fax: [phone]                                                               Date:  Reason for Visit: Hepatitis C    HPI: Frederick Franklin is a 32 y.o.old male with   Denies h/o injectable or intranasal cocaine use, blood transfusion, sharing of toothbrushes/razors, or sexual contact with known positive partners, incarceration or Armed forces logistics/support/administrative officer.  No personal or family history of liver disease, Hepatitis or Liver cancer.  He has not received treatment to date   Denies any hospitalizations related to liver disease, jaundice, ascites, GI bleeding, mental status changes, abdominal pain and acholic stool.   07/30/22 Hep B surface ag NR,  HCV ab reactive, HCV RNA  4290000 IU/ml, 6.63 log IU/ml CBC  CMP - TB 0.8, AST 54, ALT 58U/L  ROS: Denies yellowish discoloration of sclera and skin, abdominal pain/distension, hematemesis.            Dcough, fever, chills, nightsweats, nausea, vomiting, diarrhea, constipation, weight loss, recent hospitalizations, rashes, joint complaints, shortness of breath, chest pain, headaches, dysuria .   '@CURMED'$ @  No Known Allergies  PMH  PSH  Social  Family  Physical exam: There were no vitals taken for this visit.  Gen:  no acute distress HEENT: [place]/AT, no scleral icterus, no pale conjunctivae, hearing normal, oral mucosa moist Neck: Supple Cardio: Regular rate and rhythm Resp: Pulmonary effort normal on room air GI: Soft, nontender, nondistended GU: MSK - no pedal edema Skin: No rashes, lesions, or ecchymoses Neuro: Grossly non focal, awake, alert and oriented * 3 Psych: Calm, cooperative   Laboratory  CBC w diff CMP PT/PTT/INR Hep A and B serologies  HIV HCV genotype HCV RNA Measure of fibrosis NS5A  resistance testing in select scenarios    Assessment/Plan:  Hepatitis C Prior treatment:  GT: Evidence of cirrhosis:  Interested in treatment: Potential DDI   Smoking/Alcohol/Illicit substance use    Counseling done on the following -Natural progression of hep c, transmission (avoid sharing personal hygiene equipment), prevention, risks of left untreated and treatment options  -Avoid hepatotoxins like alcohol and excessive acetamaminphen (no more than 2 gram a day) -Avoid eating raw sea food -Risks of re-infection  -Hepatitis coinfection and vaccination( Pneumococcal vaccination in the cirrhotics    - Rachel screening with [place] every 6 months - EGD to r/o varices in cirrhotics   I have personally spent more than 70 minutes involved in face-to-face and non-face-to-face activities for this patient on the day of the visit. Professional time spent includes the following activities: Preparing to see the patient (review of tests), Obtaining and/or reviewing separately obtained history (admission/discharge record), Performing a medically appropriate examination and/or evaluation ,  Ordering medications/tests/procedures, referring and communicating with other health care professionals, Documenting clinical information in the EMR, Independently interpreting results (not separately reported), Communicating results to the patient/family/caregiver, Counseling and educating the patient/family/caregiver and Care coordination (not separately reported).   Patients questions were addressed and answered.   Electronically signed by:  Rosiland Oz, MD Infectious Diseases  Office phone [phone] Fax no. [phone]

## 2022-10-20 ENCOUNTER — Other Ambulatory Visit: Payer: Self-pay

## 2022-10-20 ENCOUNTER — Encounter: Payer: Self-pay | Admitting: Internal Medicine

## 2022-10-20 ENCOUNTER — Ambulatory Visit (INDEPENDENT_AMBULATORY_CARE_PROVIDER_SITE_OTHER): Payer: Self-pay | Admitting: Internal Medicine

## 2022-10-20 VITALS — BP 117/79 | HR 69 | Temp 98.2°F

## 2022-10-20 DIAGNOSIS — B182 Chronic viral hepatitis C: Secondary | ICD-10-CM

## 2022-10-20 NOTE — Progress Notes (Signed)
Patient ID: Frederick Franklin, male   DOB: [birth year], 32 y.o.   MRN: [id number]     [facility] for Infectious Disease      Reason for Consult:Chronic hepatitis C    Referring Physician: [facility]    Patient ID: Frederick Franklin, male    DOB: 06/05/1991, 32 y.o.   MRN: [id number]  HPI:   Frederick Franklin is here for consideration for treatment of hepatitis C. He was tested in January as part of his evaluation with [facility] and positive with an RNA of over 4 million with a negative hepatitis B surface Ag.  He has never been on treatment and interested in starting.  Did use IV drugs but had not shared needles.  Recent roommate with hepatitis C and shared a razor.     Past Medical History:  Diagnosis Date   Depression     Prior to Admission medications   Medication Sig Start Date End Date Taking? Authorizing Provider  famotidine (PEPCID) 20 MG tablet Take 1 tablet (20 mg total) by mouth at bedtime. 04/19/20   Dana Allan I, MD  nicotine (NICODERM CQ - DOSED IN MG/24 HOURS) 21 mg/24hr patch Place 1 patch (21 mg total) onto the skin daily. 04/20/20   Bonnell Public, MD  QUEtiapine (SEROQUEL) 200 MG tablet Take 1 tablet (200 mg total) by mouth at bedtime. 04/19/20   Bonnell Public, MD    No Known Allergies  Social History   Tobacco Use   Smoking status: Every Day    Types: E-cigarettes   Smokeless tobacco: Never  Vaping Use   Vaping Use: Every day  Substance Use Topics   Alcohol use: No   Drug use: Never    Family History  Problem Relation Age of Onset   Diabetes Paternal Grandmother     Review of Systems  Constitutional: negative for sweats, fatigue, and malaise All other systems reviewed and are negative    Constitutional: in no apparent distress There were no vitals filed for this visit. EYES: anicteric Respiratory: normal respiratory effort Musculoskeletal: no edema  Labs: Lab Results  Component Value Date   WBC 6.7 04/19/2020   HGB 13.6 04/19/2020   HCT 41.1 04/19/2020    MCV 80.3 04/19/2020   PLT 161 04/19/2020    Lab Results  Component Value Date   CREATININE 0.80 04/19/2020   BUN 9 04/19/2020   NA 140 04/19/2020   K 4.5 04/19/2020   CL 106 04/19/2020   CO2 24 04/19/2020    Lab Results  Component Value Date   ALT 77 (H) 04/19/2020   AST 74 (H) 04/19/2020   ALKPHOS 88 04/19/2020   BILITOT 0.5 04/19/2020   INR 1.3 (H) 04/16/2020     Assessment: chronic hepatitis C.  Will complete labs and check a genotype.  Will consider treatment with appropriate medications through [facility].  Will get an ultrasound with his history of fatty liver.   Plan: 1)  labs 2) ultrasound 3) prescribe once known Follow up after starting medication

## 2022-10-23 ENCOUNTER — Ambulatory Visit (HOSPITAL_COMMUNITY)
Admission: RE | Admit: 2022-10-23 | Discharge: 2022-10-23 | Disposition: A | Discharge status: Home / Self Care | Payer: Self-pay | Source: Ambulatory Visit | Attending: Internal Medicine | Admitting: Internal Medicine

## 2022-10-23 ENCOUNTER — Other Ambulatory Visit: Payer: Self-pay | Admitting: Internal Medicine

## 2022-10-23 ENCOUNTER — Other Ambulatory Visit (HOSPITAL_COMMUNITY): Payer: Self-pay

## 2022-10-23 DIAGNOSIS — B182 Chronic viral hepatitis C: Secondary | ICD-10-CM | POA: Insufficient documentation

## 2022-10-23 MED ORDER — SOFOSBUVIR-VELPATASVIR 400-100 MG PO TABS
1.0000 | ORAL_TABLET | Freq: Every day | ORAL | 2 refills | Status: AC
  Filled 2022-10-23 – 2022-10-29 (×2): qty 28, 28d supply, fill #0
  Filled 2022-11-25: qty 28, 28d supply, fill #1
  Filled 2022-12-18: qty 28, 28d supply, fill #2

## 2022-10-26 ENCOUNTER — Telehealth: Payer: Self-pay

## 2022-10-26 ENCOUNTER — Other Ambulatory Visit (HOSPITAL_COMMUNITY): Payer: Self-pay

## 2022-10-26 NOTE — Telephone Encounter (Signed)
[facility] Patient Advocate Encounter   Received notification from [facility] that prior authorization for Dorita Fray is required.   PA submitted on 10/25/25 Key [id number] Status is pending    [facility] Clinic will continue to follow.   [name], CPhT Specialty Pharmacy Patient [facility] for Infectious Disease Phone: [phone] Fax:  [phone]

## 2022-10-27 LAB — LIVER FIBROSIS, FIBROTEST-ACTITEST
ALT: 66 U/L — ABNORMAL HIGH (ref 9–46)
Alpha-2-Macroglobulin: 296 mg/dL — ABNORMAL HIGH (ref 106–279)
Apolipoprotein A1: 148 mg/dL (ref 94–176)
Bilirubin: 0.4 mg/dL (ref 0.2–1.2)
Fibrosis Score: 0.41
GGT: 30 U/L (ref 3–90)
Haptoglobin: 31 mg/dL — ABNORMAL LOW (ref 43–212)
Necroinflammat ACT Score: 0.43
Reference ID: 4858106

## 2022-10-27 LAB — HEPATITIS C RNA QUANTITATIVE
HCV Quantitative Log: 6.34 log IU/mL — ABNORMAL HIGH
HCV RNA, PCR, QN: 2170000 IU/mL — ABNORMAL HIGH

## 2022-10-27 LAB — HEPATITIS B SURFACE ANTIBODY,QUALITATIVE: Hep B S Ab: REACTIVE — AB

## 2022-10-27 LAB — HEPATITIS A ANTIBODY, TOTAL: Hepatitis A AB,Total: REACTIVE — AB

## 2022-10-27 LAB — HEPATITIS C GENOTYPE

## 2022-10-29 ENCOUNTER — Other Ambulatory Visit: Payer: Self-pay

## 2022-10-29 ENCOUNTER — Telehealth: Payer: Self-pay

## 2022-10-29 ENCOUNTER — Other Ambulatory Visit (HOSPITAL_COMMUNITY): Payer: Self-pay

## 2022-10-29 NOTE — Telephone Encounter (Signed)
[facility] Patient Advocate Encounter  Prior Authorization for Frederick Franklin has been approved.    PA# [id number] Effective dates: 10/29/22 through 01/21/23  Patients co-pay is $30.00.   Copay Card         [facility] Clinic will continue to follow.  [name], CPhT Specialty Pharmacy Patient [name] for Infectious Disease Phone: [phone] Fax:  [phone]

## 2022-10-30 ENCOUNTER — Telehealth: Payer: Self-pay

## 2022-10-30 NOTE — Telephone Encounter (Signed)
[facility] Patient Advocate Encounter  Patient's medications have been couriered to [facility] from [facility] Specialty pharmacy and will be picked up 11/02/22.  1st Epclusa box  [name] , CPhT Specialty Pharmacy Patient [facility] for Infectious Disease Phone: [phone] Fax:  [phone]

## 2022-10-30 NOTE — Telephone Encounter (Signed)
Patient called wanting to know US abdomen results.   Frederick Franklin Lesli Albee, CMA

## 2022-10-30 NOTE — Telephone Encounter (Signed)
Called the patient and counseled on Epclusa. Explained how to take the medicine (1 pill once daily with or without food), the importance of patient compliance, and possible side effects including headache, fatigue, and nausea. Screened for drug interactions. Asked the patient to call if they start any new medications or have any issues with treatment. Emphasized that this is a three month treatment. Patient voiced understanding.    Blane Ohara, PharmD  PGY1 Pharmacy Resident

## 2022-11-17 ENCOUNTER — Other Ambulatory Visit (HOSPITAL_COMMUNITY): Payer: Self-pay

## 2022-11-25 ENCOUNTER — Other Ambulatory Visit: Payer: Self-pay

## 2022-11-25 ENCOUNTER — Other Ambulatory Visit (HOSPITAL_COMMUNITY): Payer: Self-pay

## 2022-11-26 ENCOUNTER — Telehealth: Payer: Self-pay

## 2022-11-26 NOTE — Telephone Encounter (Signed)
[facility] Patient Advocate Encounter  Patient's medications have been couriered to [facility] from [facility] Specialty pharmacy and will be picked up 12/02/22.  Dorita Fray 2nd box    [name] , CPhT Specialty Pharmacy Patient [facility] for Infectious Disease Phone: [phone] Fax:  [phone]

## 2022-11-27 ENCOUNTER — Other Ambulatory Visit (HOSPITAL_COMMUNITY): Payer: Self-pay

## 2022-12-02 ENCOUNTER — Ambulatory Visit: Payer: Self-pay | Admitting: Pharmacist

## 2022-12-02 NOTE — Progress Notes (Deleted)
12/02/2022  HPI: Frederick Franklin is a 32 y.o. male who presents to the [facility] pharmacy clinic for Hepatitis C follow-up.  Medication: Dorita Fray  Start Date: 11/02/22  Hepatitis C Genotype: 1a  Fibrosis Score: 0.41  Hepatitis C RNA: 2,170,000  Patient Active Problem List   Diagnosis Date Noted   Chronic hepatitis C without hepatic coma (HCC) 10/20/2022   Severe sepsis with acute organ dysfunction (HCC) 04/16/2020   Hyperglycemia 04/16/2020   Tobacco use 04/16/2020   Microcytic anemia 04/16/2020   Hypokalemia 04/16/2020   Cocaine abuse with cocaine-induced mood disorder (HCC) 03/22/2020   Benzodiazepine abuse (HCC) 03/22/2020   Severe recurrent major depression without psychotic features (HCC) 03/19/2020   Polysubstance abuse (HCC) 03/17/2020   Opioid use disorder, severe, dependence (HCC)     Patient's Medications  New Prescriptions   No medications on file  Previous Medications   FAMOTIDINE (PEPCID) 20 MG TABLET    Take 1 tablet (20 mg total) by mouth at bedtime.   NICOTINE (NICODERM CQ - DOSED IN MG/24 HOURS) 21 MG/24HR PATCH    Place 1 patch (21 mg total) onto the skin daily.   QUETIAPINE (SEROQUEL) 200 MG TABLET    Take 1 tablet (200 mg total) by mouth at bedtime.   SOFOSBUVIR-VELPATASVIR (EPCLUSA) 400-100 MG TABS    Take 1 tablet by mouth daily.  Modified Medications   No medications on file  Discontinued Medications   No medications on file    Allergies: No Known Allergies  Past Medical History: Past Medical History:  Diagnosis Date   Depression     Social History: Social History   Socioeconomic History   Marital status: Single    Spouse name: Not on file   Number of children: Not on file   Years of education: Not on file   Highest education level: Not on file  Occupational History   Not on file  Tobacco Use   Smoking status: Every Day    Types: E-cigarettes   Smokeless tobacco: Never  Vaping Use   Vaping Use: Every day  Substance and Sexual Activity    Alcohol use: No   Drug use: Never   Sexual activity: Not on file  Other Topics Concern   Not on file  Social History Narrative   Not on file   Social Determinants of Health   Financial Resource Strain: Not on file  Food Insecurity: Not on file  Transportation Needs: Not on file  Physical Activity: Not on file  Stress: Not on file  Social Connections: Not on file    Labs: Hepatitis C Lab Results  Component Value Date   HCVGENOTYPE 1a 10/20/2022   HCVRNAPCRQN 2,170,000 (H) 10/20/2022   FIBROSTAGE F1-F2 10/20/2022   Hepatitis B Lab Results  Component Value Date   HEPBSAB REACTIVE (A) 10/20/2022   HEPBSAG NON REACTIVE 04/18/2020   Hepatitis A Lab Results  Component Value Date   HAV REACTIVE (A) 10/20/2022   HIV No results found for: "HIV" Lab Results  Component Value Date   CREATININE 0.80 04/19/2020   CREATININE 0.66 04/18/2020   CREATININE 0.56 (L) 04/17/2020   CREATININE 0.89 04/16/2020   CREATININE 0.76 03/16/2020   Lab Results  Component Value Date   AST 74 (H) 04/19/2020   AST 91 (H) 04/18/2020   AST 105 (H) 04/17/2020   ALT 66 (H) 10/20/2022   ALT 77 (H) 04/19/2020   ALT 84 (H) 04/18/2020   INR 1.3 (H) 04/16/2020    Assessment: Frederick Franklin  presents to [facility] clinic today for 1 month follow up of hepatitis C management after initiation of Epclusa.   Plan: -Received next month supply of Epclusa with instructions to return next month for next prescription -Follow up HCV RNA assay and CMP -Next appointment *** -Call if any questions  Irish Elders, PharmD PGY-1 [facility] Pharmacy Resident

## 2022-12-09 NOTE — Progress Notes (Unsigned)
12/09/2022  HPI: Frederick Franklin is a 32 y.o. male who presents to the [facility] pharmacy clinic for Hepatitis C follow-up.  Medication: Dorita Fray  Start Date: 11/02/22  Hepatitis C Genotype: 1a  Fibrosis Score: 0.41  Hepatitis C RNA: 2,170,000  Patient Active Problem List   Diagnosis Date Noted   Chronic hepatitis C without hepatic coma (HCC) 10/20/2022   Severe sepsis with acute organ dysfunction (HCC) 04/16/2020   Hyperglycemia 04/16/2020   Tobacco use 04/16/2020   Microcytic anemia 04/16/2020   Hypokalemia 04/16/2020   Cocaine abuse with cocaine-induced mood disorder (HCC) 03/22/2020   Benzodiazepine abuse (HCC) 03/22/2020   Severe recurrent major depression without psychotic features (HCC) 03/19/2020   Polysubstance abuse (HCC) 03/17/2020   Opioid use disorder, severe, dependence (HCC)     Patient's Medications  New Prescriptions   No medications on file  Previous Medications   FAMOTIDINE (PEPCID) 20 MG TABLET    Take 1 tablet (20 mg total) by mouth at bedtime.   NICOTINE (NICODERM CQ - DOSED IN MG/24 HOURS) 21 MG/24HR PATCH    Place 1 patch (21 mg total) onto the skin daily.   QUETIAPINE (SEROQUEL) 200 MG TABLET    Take 1 tablet (200 mg total) by mouth at bedtime.   SOFOSBUVIR-VELPATASVIR (EPCLUSA) 400-100 MG TABS    Take 1 tablet by mouth daily.  Modified Medications   No medications on file  Discontinued Medications   No medications on file    Allergies: No Known Allergies  Past Medical History: Past Medical History:  Diagnosis Date   Depression     Social History: Social History   Socioeconomic History   Marital status: Single    Spouse name: Not on file   Number of children: Not on file   Years of education: Not on file   Highest education level: Not on file  Occupational History   Not on file  Tobacco Use   Smoking status: Every Day    Types: E-cigarettes   Smokeless tobacco: Never  Vaping Use   Vaping Use: Every day  Substance and Sexual Activity    Alcohol use: No   Drug use: Never   Sexual activity: Not on file  Other Topics Concern   Not on file  Social History Narrative   Not on file   Social Determinants of Health   Financial Resource Strain: Not on file  Food Insecurity: Not on file  Transportation Needs: Not on file  Physical Activity: Not on file  Stress: Not on file  Social Connections: Not on file    Labs: Hepatitis C Lab Results  Component Value Date   HCVGENOTYPE 1a 10/20/2022   HCVRNAPCRQN 2,170,000 (H) 10/20/2022   FIBROSTAGE F1-F2 10/20/2022   Hepatitis B Lab Results  Component Value Date   HEPBSAB REACTIVE (A) 10/20/2022   HEPBSAG NON REACTIVE 04/18/2020   Hepatitis A Lab Results  Component Value Date   HAV REACTIVE (A) 10/20/2022   HIV No results found for: "HIV" Lab Results  Component Value Date   CREATININE 0.80 04/19/2020   CREATININE 0.66 04/18/2020   CREATININE 0.56 (L) 04/17/2020   CREATININE 0.89 04/16/2020   CREATININE 0.76 03/16/2020   Lab Results  Component Value Date   AST 74 (H) 04/19/2020   AST 91 (H) 04/18/2020   AST 105 (H) 04/17/2020   ALT 66 (H) 10/20/2022   ALT 77 (H) 04/19/2020   ALT 84 (H) 04/18/2020   INR 1.3 (H) 04/16/2020    Assessment: Frederick Franklin  presents to [facility] clinic today for 1 month follow up of hepatitis C management after initiation of Epclusa.   Plan: -Received next month supply of Epclusa with instructions to return next month for next prescription -Follow up HCV RNA assay and CMP -Next appointment *** -Call if any questions  Irish Elders, PharmD PGY-1 [facility] Pharmacy Resident

## 2022-12-10 ENCOUNTER — Ambulatory Visit (INDEPENDENT_AMBULATORY_CARE_PROVIDER_SITE_OTHER): Payer: Self-pay | Admitting: Pharmacist

## 2022-12-10 ENCOUNTER — Other Ambulatory Visit: Payer: Self-pay

## 2022-12-10 DIAGNOSIS — B182 Chronic viral hepatitis C: Secondary | ICD-10-CM

## 2022-12-12 LAB — COMPREHENSIVE METABOLIC PANEL
AG Ratio: 1.4 (calc) (ref 1.0–2.5)
ALT: 11 U/L (ref 9–46)
AST: 18 U/L (ref 10–40)
Albumin: 4.2 g/dL (ref 3.6–5.1)
Alkaline phosphatase (APISO): 77 U/L (ref 36–130)
BUN: 16 mg/dL (ref 7–25)
CO2: 28 mmol/L (ref 20–32)
Calcium: 9.1 mg/dL (ref 8.6–10.3)
Chloride: 103 mmol/L (ref 98–110)
Creat: 0.78 mg/dL (ref 0.60–1.26)
Globulin: 3 g/dL (calc) (ref 1.9–3.7)
Glucose, Bld: 98 mg/dL (ref 65–99)
Potassium: 4.1 mmol/L (ref 3.5–5.3)
Sodium: 138 mmol/L (ref 135–146)
Total Bilirubin: 0.3 mg/dL (ref 0.2–1.2)
Total Protein: 7.2 g/dL (ref 6.1–8.1)

## 2022-12-12 LAB — HEPATITIS C RNA QUANTITATIVE
HCV Quantitative Log: <1.18 log IU/mL
HCV RNA, PCR, QN: <15 IU/mL

## 2022-12-15 ENCOUNTER — Other Ambulatory Visit: Payer: Self-pay

## 2022-12-18 ENCOUNTER — Other Ambulatory Visit (HOSPITAL_COMMUNITY): Payer: Self-pay

## 2022-12-22 ENCOUNTER — Telehealth: Payer: Self-pay

## 2022-12-22 NOTE — Telephone Encounter (Signed)
[facility] Patient Advocate Encounter  Patient's medications have been couriered to [facility] from [facility] and will be picked up 12/23/22.  3rd Epclusa box  [name] , CPhT Specialty Pharmacy Patient [facility] Phone: [phone] Fax:  [phone]

## 2022-12-23 NOTE — Telephone Encounter (Signed)
Patient picked up Epclusa this morning.   Arby Dahir L. Jannette Fogo, PharmD, BCIDP, AAHIVP, CPP Clinical Pharmacist Practitioner Infectious Diseases Clinical Pharmacist [facility] 12/23/2022, 10:21 AM

## 2023-01-20 ENCOUNTER — Other Ambulatory Visit (HOSPITAL_COMMUNITY): Payer: Self-pay

## 2023-02-10 ENCOUNTER — Ambulatory Visit: Payer: Self-pay | Admitting: Pharmacist

## 2023-03-11 ENCOUNTER — Other Ambulatory Visit: Payer: Self-pay
